# Patient Record
Sex: Female | Born: 1945 | Race: Black or African American | Hispanic: No | Smoking: Never smoker
Health system: Southern US, Community
[De-identification: ages and names within clinical notes are randomized; demographics above are authoritative.]

## PROBLEM LIST (undated history)

## (undated) DIAGNOSIS — T4145XA Adverse effect of unspecified anesthetic, initial encounter: Secondary | ICD-10-CM

## (undated) DIAGNOSIS — M1990 Unspecified osteoarthritis, unspecified site: Secondary | ICD-10-CM

## (undated) DIAGNOSIS — I739 Peripheral vascular disease, unspecified: Secondary | ICD-10-CM

## (undated) DIAGNOSIS — I10 Essential (primary) hypertension: Secondary | ICD-10-CM

## (undated) DIAGNOSIS — R112 Nausea with vomiting, unspecified: Secondary | ICD-10-CM

## (undated) DIAGNOSIS — T8859XA Other complications of anesthesia, initial encounter: Secondary | ICD-10-CM

## (undated) DIAGNOSIS — Z9889 Other specified postprocedural states: Secondary | ICD-10-CM

## (undated) DIAGNOSIS — E119 Type 2 diabetes mellitus without complications: Secondary | ICD-10-CM

## (undated) HISTORY — PX: BREAST EXCISIONAL BIOPSY: SUR124

## (undated) HISTORY — PX: EYE SURGERY: SHX253

## (undated) HISTORY — PX: CATARACT EXTRACTION, BILATERAL: SHX1313

## (undated) HISTORY — PX: ABDOMINAL HYSTERECTOMY: SHX81

---

## 1999-10-01 HISTORY — PX: GASTRIC BYPASS: SHX52

## 2011-05-08 ENCOUNTER — Ambulatory Visit (HOSPITAL_COMMUNITY)
Admission: RE | Admit: 2011-05-08 | Discharge: 2011-05-08 | Disposition: A | Discharge status: Home / Self Care | Payer: BC Managed Care – PPO | Source: Ambulatory Visit | Attending: Ophthalmology | Admitting: Ophthalmology

## 2011-05-08 DIAGNOSIS — E119 Type 2 diabetes mellitus without complications: Secondary | ICD-10-CM | POA: Insufficient documentation

## 2011-05-08 DIAGNOSIS — K219 Gastro-esophageal reflux disease without esophagitis: Secondary | ICD-10-CM | POA: Insufficient documentation

## 2011-05-08 DIAGNOSIS — H269 Unspecified cataract: Secondary | ICD-10-CM | POA: Insufficient documentation

## 2011-05-08 DIAGNOSIS — I10 Essential (primary) hypertension: Secondary | ICD-10-CM | POA: Insufficient documentation

## 2011-05-08 LAB — CBC
HCT: 36.3 % (ref 36.0–46.0)
Hemoglobin: 12.1 g/dL (ref 12.0–15.0)
RBC: 4.33 MIL/uL (ref 3.87–5.11)
WBC: 4.3 10*3/uL (ref 4.0–10.5)

## 2011-05-08 LAB — BASIC METABOLIC PANEL
BUN: 20 mg/dL (ref 6–23)
CO2: 26 mEq/L (ref 19–32)
Chloride: 105 mEq/L (ref 96–112)
GFR calc non Af Amer: >60 mL/min (ref >60)
Glucose, Bld: 163 mg/dL — ABNORMAL HIGH (ref 70–99)
Potassium: 4.5 mEq/L (ref 3.5–5.1)
Sodium: 140 mEq/L (ref 135–145)

## 2011-05-08 LAB — GLUCOSE, CAPILLARY: Glucose-Capillary: 152 mg/dL — ABNORMAL HIGH (ref 70–99)

## 2011-05-13 ENCOUNTER — Encounter: Payer: Self-pay | Admitting: Gastroenterology

## 2011-06-05 ENCOUNTER — Other Ambulatory Visit: Payer: BC Managed Care – PPO | Admitting: Gastroenterology

## 2011-07-18 NOTE — Op Note (Signed)
  NAME:  Alexandra Tran, Alexandra Tran NO.:  [id number]  MEDICAL RECORD NO.:  [id number]  LOCATION:  [facility]                         FACILITY:  [facility]  PHYSICIAN:  Chalmers Guest, M.D.     DATE OF BIRTH:  January 25, 1946  DATE OF PROCEDURE:  05/08/2011 DATE OF DISCHARGE:  05/08/2011                              OPERATIVE REPORT   PREOPERATIVE DIAGNOSIS:  Visually significant cataract right eye.  POSTOPERATIVE DIAGNOSIS:  Visually significant cataract right eye.  PROCEDURE:  Phacoemulsification intraocular lens implant.  COMPLICATIONS:  None.  ANESTHESIA:  Xylocaine 2% and a 50:50 mixture of 0.75% Marcaine with Wydase with epinephrine.  PROCEDURE:  The patient was given a peribulbar block in the operating room under monitored anesthesia with the aforementioned local anesthetic agent.  Following this, the pressure was applied to the eye and then the patient's face was prepped and draped in the usual sterile fashion with the surgeon sitting temporally.  A Weck-cel sponge was used to fixate the globe and then a 15-degree blade was used to enter through superior clear cornea.  Following this, Viscoat was injected in the eye.  An additional Weck-cel sponge was used to fixate the globe, then a 2.5-mm keratome blade was used in a stepwise fashion through temporal clear cornea to enter the eye.  Viscoat was injected. I bent the 25-gauge needle.  The assistant did not have a prebent needle for me available. After bending the needle, an incision was made in the anterior capsule and a continuous tear curvilinear capsulorrhexis was performed.  BSS was used to hydrodissect and hydrodelineate the nucleus.  Following this, the phacoemulsification unit was then used to sculpt the nucleus and the nucleus was divided into three quadrants and then the nuclear fragments were removed.  Viscoat was used to elevate the remaining epinucleus and the phacoemulsification was used to remove all nuclear material  from the eye.  The IA was then used to remove cortical fibers and stripping the posterior capsule.  The __________ cannula was used.  The curved IA was used to remove sub incisional cortex.  The posterior capsule remained intact.  Therefore, Provisc was injected in the eye.  The intraocular lens implant which was a preselected alkaline Aqua Soft SN 60 WF 20.5 diopter lens, SN number [id number] was placed in the lens injector and then injected in the eye.  The lens was positioned with the Kuglen hook. The IA was then used to remove viscoelastic from the eye.  An 10-0 nylon suture was placed.  Eye was pressurized, there being no leakage. Miochol was injected in the eye and all instrumentation removed.  The pupil was round at the end of the case.  TobraDex was placed in the eye. A patch and Fox shield were placed and the patient returned to recovery area in stable condition.    Chalmers Guest, M.D.    RW/MEDQ  D:  05/08/2011  T:  05/09/2011  Job:  [id number]  Electronically Signed by Chalmers Guest M.D. on 07/18/2011 05:56:22 PM

## 2011-10-10 ENCOUNTER — Ambulatory Visit: Payer: Self-pay | Admitting: Internal Medicine

## 2012-03-03 DIAGNOSIS — Z9884 Bariatric surgery status: Secondary | ICD-10-CM | POA: Insufficient documentation

## 2012-03-03 DIAGNOSIS — R531 Weakness: Secondary | ICD-10-CM | POA: Insufficient documentation

## 2013-04-05 ENCOUNTER — Ambulatory Visit: Payer: Self-pay | Admitting: Obstetrics and Gynecology

## 2013-04-09 ENCOUNTER — Ambulatory Visit: Payer: Self-pay | Admitting: Neurology

## 2013-04-22 ENCOUNTER — Ambulatory Visit: Payer: Self-pay | Admitting: Obstetrics and Gynecology

## 2013-12-08 DIAGNOSIS — R609 Edema, unspecified: Secondary | ICD-10-CM | POA: Insufficient documentation

## 2014-04-06 ENCOUNTER — Ambulatory Visit: Payer: Self-pay | Admitting: Internal Medicine

## 2014-08-09 DIAGNOSIS — E1139 Type 2 diabetes mellitus with other diabetic ophthalmic complication: Secondary | ICD-10-CM | POA: Insufficient documentation

## 2014-11-02 DIAGNOSIS — G5601 Carpal tunnel syndrome, right upper limb: Secondary | ICD-10-CM | POA: Insufficient documentation

## 2014-11-09 DIAGNOSIS — I739 Peripheral vascular disease, unspecified: Secondary | ICD-10-CM | POA: Insufficient documentation

## 2014-11-09 DIAGNOSIS — R600 Localized edema: Secondary | ICD-10-CM | POA: Insufficient documentation

## 2014-11-09 DIAGNOSIS — G8929 Other chronic pain: Secondary | ICD-10-CM | POA: Insufficient documentation

## 2014-11-09 DIAGNOSIS — J302 Other seasonal allergic rhinitis: Secondary | ICD-10-CM | POA: Insufficient documentation

## 2014-11-09 DIAGNOSIS — K589 Irritable bowel syndrome without diarrhea: Secondary | ICD-10-CM | POA: Insufficient documentation

## 2014-11-09 DIAGNOSIS — M5020 Other cervical disc displacement, unspecified cervical region: Secondary | ICD-10-CM | POA: Insufficient documentation

## 2014-11-09 DIAGNOSIS — M1990 Unspecified osteoarthritis, unspecified site: Secondary | ICD-10-CM | POA: Insufficient documentation

## 2014-11-09 DIAGNOSIS — N3281 Overactive bladder: Secondary | ICD-10-CM | POA: Insufficient documentation

## 2014-11-09 DIAGNOSIS — M797 Fibromyalgia: Secondary | ICD-10-CM | POA: Insufficient documentation

## 2015-03-24 ENCOUNTER — Other Ambulatory Visit: Payer: Self-pay | Admitting: Internal Medicine

## 2015-03-24 DIAGNOSIS — Z1231 Encounter for screening mammogram for malignant neoplasm of breast: Secondary | ICD-10-CM

## 2015-04-10 ENCOUNTER — Ambulatory Visit
Admission: RE | Admit: 2015-04-10 | Discharge: 2015-04-10 | Disposition: A | Discharge status: Home / Self Care | Payer: Medicare Other | Source: Ambulatory Visit | Attending: Internal Medicine | Admitting: Internal Medicine

## 2015-04-10 ENCOUNTER — Ambulatory Visit: Payer: BC Managed Care – PPO

## 2015-04-10 DIAGNOSIS — Z1231 Encounter for screening mammogram for malignant neoplasm of breast: Secondary | ICD-10-CM | POA: Diagnosis not present

## 2015-07-21 DIAGNOSIS — M4316 Spondylolisthesis, lumbar region: Secondary | ICD-10-CM | POA: Insufficient documentation

## 2015-07-21 DIAGNOSIS — M5137 Other intervertebral disc degeneration, lumbosacral region: Secondary | ICD-10-CM | POA: Insufficient documentation

## 2015-09-06 DIAGNOSIS — Z7689 Persons encountering health services in other specified circumstances: Secondary | ICD-10-CM | POA: Insufficient documentation

## 2015-09-06 DIAGNOSIS — J45909 Unspecified asthma, uncomplicated: Secondary | ICD-10-CM | POA: Insufficient documentation

## 2015-10-09 DIAGNOSIS — Z981 Arthrodesis status: Secondary | ICD-10-CM | POA: Insufficient documentation

## 2016-03-12 ENCOUNTER — Other Ambulatory Visit: Payer: Self-pay | Admitting: Internal Medicine

## 2016-03-12 DIAGNOSIS — Z1231 Encounter for screening mammogram for malignant neoplasm of breast: Secondary | ICD-10-CM

## 2016-04-01 ENCOUNTER — Other Ambulatory Visit: Payer: Self-pay | Admitting: Specialist

## 2016-04-01 DIAGNOSIS — I739 Peripheral vascular disease, unspecified: Secondary | ICD-10-CM

## 2016-04-08 ENCOUNTER — Inpatient Hospital Stay: Admission: RE | Admit: 2016-04-08 | Payer: BC Managed Care – PPO | Source: Ambulatory Visit

## 2016-04-12 ENCOUNTER — Ambulatory Visit: Payer: Medicare Other

## 2016-04-23 ENCOUNTER — Other Ambulatory Visit: Payer: Self-pay | Admitting: Specialist

## 2016-04-23 ENCOUNTER — Ambulatory Visit
Admission: RE | Admit: 2016-04-23 | Discharge: 2016-04-23 | Disposition: A | Discharge status: Home / Self Care | Payer: Medicare Other | Source: Ambulatory Visit | Attending: Specialist | Admitting: Specialist

## 2016-04-23 DIAGNOSIS — I739 Peripheral vascular disease, unspecified: Secondary | ICD-10-CM

## 2016-06-21 ENCOUNTER — Other Ambulatory Visit: Payer: Self-pay | Admitting: Specialist

## 2016-06-21 DIAGNOSIS — M542 Cervicalgia: Secondary | ICD-10-CM

## 2016-06-29 ENCOUNTER — Other Ambulatory Visit: Payer: Medicare Other

## 2016-07-08 ENCOUNTER — Ambulatory Visit
Admission: RE | Admit: 2016-07-08 | Discharge: 2016-07-08 | Disposition: A | Discharge status: Home / Self Care | Payer: Medicare Other | Source: Ambulatory Visit | Attending: Specialist | Admitting: Specialist

## 2016-07-08 DIAGNOSIS — M542 Cervicalgia: Secondary | ICD-10-CM

## 2016-07-15 ENCOUNTER — Ambulatory Visit (INDEPENDENT_AMBULATORY_CARE_PROVIDER_SITE_OTHER): Payer: Self-pay | Admitting: Specialist

## 2016-07-17 ENCOUNTER — Other Ambulatory Visit: Payer: Self-pay | Admitting: Vascular Surgery

## 2016-07-17 DIAGNOSIS — I7092 Chronic total occlusion of artery of the extremities: Secondary | ICD-10-CM

## 2016-07-22 ENCOUNTER — Telehealth (INDEPENDENT_AMBULATORY_CARE_PROVIDER_SITE_OTHER): Payer: Self-pay | Admitting: Specialist

## 2016-07-26 ENCOUNTER — Telehealth (INDEPENDENT_AMBULATORY_CARE_PROVIDER_SITE_OTHER): Payer: Self-pay | Admitting: Specialist

## 2016-07-26 NOTE — Telephone Encounter (Signed)
[employer] Life Disability Claim Form

## 2016-07-26 NOTE — Telephone Encounter (Signed)
Received form and working on this, will call patient when completed.

## 2016-07-30 ENCOUNTER — Encounter: Payer: Self-pay | Admitting: Vascular Surgery

## 2016-07-31 NOTE — Telephone Encounter (Signed)
Form completed, called patient and advise. Mailed copy of form to patient . Thanks. Nira Conn

## 2016-08-29 ENCOUNTER — Encounter (HOSPITAL_COMMUNITY): Payer: Medicare Other

## 2016-08-29 ENCOUNTER — Encounter: Payer: Medicare Other | Admitting: Vascular Surgery

## 2016-09-02 ENCOUNTER — Encounter: Payer: Self-pay | Admitting: Vascular Surgery

## 2016-09-05 ENCOUNTER — Encounter (INDEPENDENT_AMBULATORY_CARE_PROVIDER_SITE_OTHER): Payer: Self-pay | Admitting: Specialist

## 2016-09-05 ENCOUNTER — Encounter (HOSPITAL_COMMUNITY): Payer: Medicare Other

## 2016-09-05 ENCOUNTER — Ambulatory Visit (INDEPENDENT_AMBULATORY_CARE_PROVIDER_SITE_OTHER): Payer: Medicare Other | Admitting: Specialist

## 2016-09-05 ENCOUNTER — Encounter: Payer: Medicare Other | Admitting: Vascular Surgery

## 2016-09-05 VITALS — BP 111/78 | HR 67 | Ht 64.0 in | Wt 215.0 lb

## 2016-09-05 DIAGNOSIS — M4802 Spinal stenosis, cervical region: Secondary | ICD-10-CM | POA: Diagnosis not present

## 2016-09-05 DIAGNOSIS — I7092 Chronic total occlusion of artery of the extremities: Secondary | ICD-10-CM

## 2016-09-05 NOTE — Progress Notes (Signed)
Office Visit Note   Patient: Alexandra Tran           Date of Birth: 06/22/1946           MRN: [id number] Visit Date: 09/05/2016              Requested by: No referring provider defined for this encounter. PCP: Caprice Renshaw, MD   Assessment & Plan: Visit Diagnoses:  1. Spinal stenosis of cervical region     Plan: Reviewed cervical spine MRI with patient today. States that she cannot say if she is really having any symptoms in either arm. No weakness. Advised patient to follow up in 4 weeks for recheck and she will pay close attention to what her symptoms are. It was somewhat difficult obtaining a history from patient today.  Patient also brought up ongoing pain with her left knee. She's had previous injection a few months ago. We may consider repeating the x-rays when she returns.  Follow-Up Instructions: Return in about 4 weeks (around 10/03/2016).   Orders:  No orders of the defined types were placed in this encounter.  No orders of the defined types were placed in this encounter.     Procedures: No procedures performed   Clinical Data: No additional findings.   Subjective: Chief Complaint  Patient presents with  . Neck - Follow-up, Pain    Patient returns today to review her MRI Cervical spine. States her neck bothers her at times but it is nothing like her back was.    Patient states that "I'm not really sure if him having any pain down into my arms or having any tingling". Continues to have some neck pain that radiates into the bilateral trapezius and scapular area. Denies arm weakness. States that she's had cervical ESI's in the past and they did not give any relief and she does not want to have this done again. Review of Systems  Respiratory: Negative.   Musculoskeletal: Positive for neck pain.  Neurological: Negative for weakness and numbness.  Psychiatric/Behavioral:       Somewhat difficult to obtain history from patient states that she does have a  flight of ideas.     Objective: Vital Signs: Ht 5\' 4"  (1.626 m)   Wt 215 lb (97.5 kg)   BMI 36.90 kg/m   Physical Exam  Constitutional: She is oriented to person, place, and time. She appears well-developed. No distress.  HENT:  Head: Normocephalic and atraumatic.  Abdominal: She exhibits no distension.  Musculoskeletal: Normal range of motion.  Shoulders good range of motion. Negative impingement test. No focal motor deficits.  Neurological: She is alert and oriented to person, place, and time.  Skin: Skin is warm and dry.    Ortho Exam  Specialty Comments:  No specialty comments available.  Imaging: No results found.   PMFS History: There are no active problems to display for this patient.  No past medical history on file.  No family history on file.  Past Surgical History:  Procedure Laterality Date  . BREAST EXCISIONAL BIOPSY Right    negative   Social History   Occupational History  . Not on file.   Social History Main Topics  . Smoking status: Never Smoker  . Smokeless tobacco: Never Used  . Alcohol use No  . Drug use: No  . Sexual activity: Not on file    Study Result   CLINICAL DATA:  70 year old female with neck and bilateral shoulder pain since 2014. Initial  encounter.  EXAM: MRI CERVICAL SPINE WITHOUT CONTRAST  TECHNIQUE: Multiplanar, multisequence MR imaging of the cervical spine was performed. No intravenous contrast was administered.  COMPARISON:  04/09/2013 cervical spine MR.  FINDINGS: Exam is motion degraded.  Alignment: Normal.  Vertebrae: No worrisome osseous abnormality.  Cord: Evaluation limited by motion.  No obvious abnormality.  Posterior Fossa, vertebral arteries, paraspinal tissues: No worrisome abnormality.  Disc levels:  C2-3: Baseline narrowed canal. Minimal bulge. Slight narrowing ventral thecal sac.  C3-4:  Baseline slight narrowing of the canal.  C4-5:  Negative.  C5-6: Broad-based  disc osteophyte complex greater to left. Narrowing ventral thecal sac greater on left with minimal left-sided cord flattening. Mild right foraminal narrowing.  C6-7:  Negative.  C7-T1: Mild facet degenerative changes. No significant spinal stenosis or foraminal narrowing.  IMPRESSION: Exam is motion degraded.  C5-6 broad-based disc osteophyte complex greater to left. Narrowing ventral thecal sac greater on left with minimal left-sided cord flattening. Mild right foraminal narrowing. Findings have changed minimally since 2014.

## 2016-10-17 ENCOUNTER — Ambulatory Visit (INDEPENDENT_AMBULATORY_CARE_PROVIDER_SITE_OTHER): Payer: Medicare Other | Admitting: Specialist

## 2017-01-23 ENCOUNTER — Telehealth (INDEPENDENT_AMBULATORY_CARE_PROVIDER_SITE_OTHER): Payer: Self-pay | Admitting: Specialist

## 2017-01-23 NOTE — Telephone Encounter (Signed)
PT WANTS TO KNOW WHAT DR. WE REFERRED HER TO FOR HER LEG. STATED SHE CAN'T REMEMBER WHO IT WAS AND I DIDN'T SEE ANYTHING IN THE CHART.  [phone]

## 2017-01-24 NOTE — Telephone Encounter (Signed)
PT WANTS TO KNOW WHAT DR. WE REFERRED HER TO FOR HER LEG. STATED SHE CAN'T REMEMBER WHO IT WAS AND I DIDN'T SEE ANYTHING IN THE CHART.-----Please advise

## 2017-02-07 NOTE — Telephone Encounter (Signed)
We can see her for her knees if she wishes surgery otherwise we don't have a lot to offer. Alexandra Tran

## 2017-02-10 NOTE — Telephone Encounter (Signed)
Patient was calling about the referral that we had made back in September to [facility].  She couldn't remember the name of the place we had sent her to.

## 2017-02-17 DIAGNOSIS — K219 Gastro-esophageal reflux disease without esophagitis: Secondary | ICD-10-CM | POA: Insufficient documentation

## 2017-02-17 DIAGNOSIS — N6019 Diffuse cystic mastopathy of unspecified breast: Secondary | ICD-10-CM | POA: Insufficient documentation

## 2017-02-17 DIAGNOSIS — E559 Vitamin D deficiency, unspecified: Secondary | ICD-10-CM | POA: Insufficient documentation

## 2017-02-17 DIAGNOSIS — I872 Venous insufficiency (chronic) (peripheral): Secondary | ICD-10-CM | POA: Insufficient documentation

## 2017-02-17 DIAGNOSIS — J329 Chronic sinusitis, unspecified: Secondary | ICD-10-CM | POA: Insufficient documentation

## 2017-02-17 DIAGNOSIS — H40059 Ocular hypertension, unspecified eye: Secondary | ICD-10-CM | POA: Insufficient documentation

## 2017-02-17 DIAGNOSIS — E785 Hyperlipidemia, unspecified: Secondary | ICD-10-CM | POA: Insufficient documentation

## 2017-02-17 DIAGNOSIS — I878 Other specified disorders of veins: Secondary | ICD-10-CM | POA: Insufficient documentation

## 2017-02-17 DIAGNOSIS — K5909 Other constipation: Secondary | ICD-10-CM | POA: Insufficient documentation

## 2017-04-17 DIAGNOSIS — E113553 Type 2 diabetes mellitus with stable proliferative diabetic retinopathy, bilateral: Secondary | ICD-10-CM | POA: Insufficient documentation

## 2017-04-17 DIAGNOSIS — Z961 Presence of intraocular lens: Secondary | ICD-10-CM | POA: Insufficient documentation

## 2017-07-31 ENCOUNTER — Ambulatory Visit (INDEPENDENT_AMBULATORY_CARE_PROVIDER_SITE_OTHER): Payer: Medicare Other | Admitting: Specialist

## 2018-01-05 DIAGNOSIS — H401124 Primary open-angle glaucoma, left eye, indeterminate stage: Secondary | ICD-10-CM | POA: Insufficient documentation

## 2018-01-05 DIAGNOSIS — H401114 Primary open-angle glaucoma, right eye, indeterminate stage: Secondary | ICD-10-CM | POA: Insufficient documentation

## 2018-05-22 ENCOUNTER — Telehealth (INDEPENDENT_AMBULATORY_CARE_PROVIDER_SITE_OTHER): Payer: Self-pay | Admitting: Specialist

## 2018-05-22 NOTE — Telephone Encounter (Signed)
Please add patient to cancellation list. Appt currently scheduled for 10/3. Patients # [phone]

## 2018-05-25 ENCOUNTER — Ambulatory Visit (INDEPENDENT_AMBULATORY_CARE_PROVIDER_SITE_OTHER): Payer: Medicare Other | Admitting: Specialist

## 2018-05-25 ENCOUNTER — Encounter (INDEPENDENT_AMBULATORY_CARE_PROVIDER_SITE_OTHER): Payer: Self-pay | Admitting: Specialist

## 2018-05-25 ENCOUNTER — Ambulatory Visit (INDEPENDENT_AMBULATORY_CARE_PROVIDER_SITE_OTHER): Payer: Self-pay

## 2018-05-25 VITALS — BP 130/71 | HR 87 | Ht 64.0 in | Wt 206.0 lb

## 2018-05-25 DIAGNOSIS — I739 Peripheral vascular disease, unspecified: Secondary | ICD-10-CM | POA: Diagnosis not present

## 2018-05-25 DIAGNOSIS — M25561 Pain in right knee: Secondary | ICD-10-CM | POA: Diagnosis not present

## 2018-05-25 DIAGNOSIS — M11261 Other chondrocalcinosis, right knee: Secondary | ICD-10-CM

## 2018-05-25 DIAGNOSIS — G8929 Other chronic pain: Secondary | ICD-10-CM | POA: Diagnosis not present

## 2018-05-25 DIAGNOSIS — M1711 Unilateral primary osteoarthritis, right knee: Secondary | ICD-10-CM | POA: Diagnosis not present

## 2018-05-25 MED ORDER — ONDANSETRON HCL 4 MG PO TABS: 4.0000 mg | ORAL_TABLET | Freq: Three times a day (TID) | ORAL | 0 refills | Status: DC | PRN

## 2018-05-25 MED ORDER — HYDROCODONE-ACETAMINOPHEN 5-325 MG PO TABS: 1.0000 | ORAL_TABLET | Freq: Four times a day (QID) | ORAL | 0 refills | Status: DC | PRN

## 2018-05-25 NOTE — Patient Instructions (Addendum)
The patient history, physical examination and imaging studies are consistent with advanced degenerative joint disease of the right knee. The patient has failed conservative treatment.  The clearance notes were reviewed.  After discussion with the patient it was felt that Total Knee Replacement was indicated. The procedure,  risks, and benefits of total knee arthroplasty were presented and reviewed. The risks including but not limited to aseptic loosening, infection, blood clots, vascular and nerve injury, stiffness, patella tracking problems and fracture complications among others were discussed. The patient acknowledged the explanation, agreed to proceed with total knee replacement. Avoid bending, stooping and avoid lifting weights greater than 10 lbs. Avoid prolong standing and walking. Order for a new walker with wheels. Surgery scheduling secretary Kandice Hams, will call you in the next week to schedule for surgery.  Surgery recommended is right knee replacemen   Knee is suffering from osteoarthritis, only real proven treatments are Weight loss, NSIADs like diclofenac and exercise. Well padded shoes help. Ice the knee 2-3 times a day 15-20 mins at a time.

## 2018-05-25 NOTE — Telephone Encounter (Signed)
I called and put pt in a cancellation spot for today @345 

## 2018-05-25 NOTE — Progress Notes (Addendum)
Office Visit Note   Patient: Alexandra Tran           Date of Birth: 1946-08-16           MRN: [id number] Visit Date: 05/25/2018              Requested by: Caprice Renshaw, MD [address] [place], [place] [address] PCP: Caprice Renshaw, MD   Assessment & Plan: Visit Diagnoses:  1. Chronic pain of right knee   2. Osteoarthritis of right patellofemoral joint   3. Chondrocalcinosis of right knee   4. Unilateral primary osteoarthritis, right knee   5. Peripheral vascular disease of lower extremity (HCC)     Plan: The patient history, physical examination and imaging studies are consistent with advanced degenerative joint disease of the right knee. The patient has failed conservative treatment.  The clearance notes were reviewed.  After discussion with the patient it was felt that Total Knee Replacement was indicated. The procedure,  risks, and benefits of total knee arthroplasty were presented and reviewed. The risks including but not limited to aseptic loosening, infection, blood clots, vascular and nerve injury, stiffness, patella tracking problems and fracture complications among others were discussed. The patient acknowledged the explanation, agreed to proceed with total knee replacement. Avoid bending, stooping and avoid lifting weights greater than 10 lbs. Avoid prolong standing and walking. Order for a new walker with wheels. Surgery scheduling secretary Kandice Hams, will call you in the next week to schedule for surgery.  Surgery recommended is right knee replacemen   Knee is suffering from osteoarthritis, only real proven treatments are Weight loss, NSIADs like diclofenac and exercise. Well padded shoes help. Ice the knee 2-3 times a day 15-20 mins at a time.  Follow-Up Instructions: Return in about 4 weeks (around 06/22/2018) for post op follow up..   Orders:  Orders Placed This Encounter  Procedures  . XR KNEE 3 VIEW RIGHT   Meds ordered this encounter    Medications  . HYDROcodone-acetaminophen (NORCO/VICODIN) 5-325 MG tablet    Sig: Take 1-2 tablets by mouth every 6 (six) hours as needed.    Dispense:  40 tablet    Refill:  0    Was given a prescription for oxycodone which she did not fill in [facility] yesterday. She is too drowsy with the oxycodone.  . ondansetron (ZOFRAN) 4 MG tablet    Sig: Take 1 tablet (4 mg total) by mouth every 8 (eight) hours as needed for nausea or vomiting.    Dispense:  20 tablet    Refill:  0      Procedures: No procedures performed   Clinical Data: No additional findings.   Subjective: Chief Complaint  Patient presents with  . Right Knee - Pain    72 year old female with 3 week history of right knee pain. She has pain with standing and weight bearing on the right leg. Pain is present at night and with first standing in the AM. I have to hollar due to to the pain. I am taking  Ibuprofen, voltaren gel and presently oxycodone from her visit to [facility]. She has seen Dr. Harl Bowie and was referred for evaluation by Dr. Harl Bowie after undergoing long segment thoracolumbar fusion. She has been participating in PT and water aerobics with good improvement in her  Discomfort until about 3 weeks ago. She has swelling in the right leg. No numbness or paresthesias.    Review of Systems  Constitutional: Positive for activity change and unexpected weight change. Negative for appetite change, chills, diaphoresis, fatigue and fever.  HENT: Negative.  Negative for congestion, dental problem, drooling, ear discharge, ear pain, facial swelling, hearing loss, mouth sores, nosebleeds, postnasal drip, rhinorrhea, sinus pressure, sinus pain, sneezing, sore throat, tinnitus, trouble swallowing and voice change.   Eyes: Negative.  Negative for photophobia, pain, discharge, redness, itching and visual disturbance.  Respiratory: Negative.  Negative for chest tightness, shortness of breath and  wheezing.   Cardiovascular: Negative.  Negative for chest pain, palpitations and leg swelling.  Gastrointestinal: Positive for nausea and vomiting. Negative for abdominal distention.  Endocrine: Negative.  Negative for cold intolerance, heat intolerance, polydipsia, polyphagia and polyuria.  Genitourinary: Negative.  Negative for difficulty urinating, dysuria, flank pain and hematuria.  Musculoskeletal: Positive for arthralgias, gait problem and joint swelling. Negative for back pain, myalgias, neck pain and neck stiffness.  Skin: Negative.  Negative for color change, pallor, rash and wound.  Allergic/Immunologic: Negative.  Negative for environmental allergies, food allergies and immunocompromised state.  Neurological: Negative for dizziness, tremors, seizures, syncope, facial asymmetry, speech difficulty, weakness, light-headedness, numbness and headaches.  Hematological: Negative.  Negative for adenopathy. Does not bruise/bleed easily.  Psychiatric/Behavioral: Negative.  Negative for agitation, behavioral problems, confusion, dysphoric mood, hallucinations, self-injury, sleep disturbance and suicidal ideas. The patient is not nervous/anxious and is not hyperactive.      Objective: Vital Signs: BP 130/71   Pulse 87   Ht 5\' 4"  (1.626 m)   Wt 206 lb (93.4 kg)   BMI 35.36 kg/m   Physical Exam  Constitutional: She is oriented to person, place, and time. She appears well-developed and well-nourished.  HENT:  Head: Normocephalic and atraumatic.  Eyes: Pupils are equal, round, and reactive to light. EOM are normal.  Neck: Normal range of motion. Neck supple.  Pulmonary/Chest: Effort normal and breath sounds normal.  Abdominal: Soft. Bowel sounds are normal.  Musculoskeletal:       Right knee: She exhibits effusion.  Neurological: She is alert and oriented to person, place, and time.  Skin: Skin is warm and dry.  Psychiatric: She has a normal mood and affect. Her behavior is normal.  Judgment and thought content normal.    Right Knee Exam   Tenderness  The patient is experiencing tenderness in the medial joint line, lateral retinaculum, lateral joint line and patella.  Range of Motion  Extension:  -10 abnormal  Flexion:  110 abnormal   Tests  McMurray:  Medial - positive  Varus: positive  Lachman:  Anterior - negative    Posterior - negative Drawer:  Anterior - negative    Posterior - negative Pivot shift: negative Patellar apprehension: negative  Other  Erythema: absent Scars: absent Sensation: normal Swelling: mild Effusion: effusion present   Left Knee Exam   Range of Motion  Extension: normal  Flexion: normal       Specialty Comments:  No specialty comments available.  Imaging: No results found.   PMFS History: There are no active problems to display for this patient.  History reviewed. No pertinent past medical history.  History reviewed. No pertinent family history.  Past Surgical History:  Procedure Laterality Date  . BREAST EXCISIONAL BIOPSY Right    negative   Social History   Occupational History  . Not on file  Tobacco Use  . Smoking status: Never Smoker  . Smokeless tobacco: Never Used  Substance and Sexual Activity  . Alcohol use: No  . Drug  use: No  . Sexual activity: Not on file

## 2018-05-27 ENCOUNTER — Telehealth (INDEPENDENT_AMBULATORY_CARE_PROVIDER_SITE_OTHER): Payer: Self-pay | Admitting: Specialist

## 2018-05-27 NOTE — Telephone Encounter (Signed)
Patient would like a call back from Dr. Louanne Tran because she cant remember anything he said about the procedure she would need before surgery, she said it would be to check her blood flow/veins to make sure shes ok for surgery. She said she hasnt heard anything from anyone [phone]

## 2018-05-27 NOTE — Telephone Encounter (Signed)
Patient called and stat that Alexandra Tran spoke to her about going to a MD about a artery prior to surgery on knee.   Please call patient in regard to the referral. Advised patient there was no referral.

## 2018-05-28 ENCOUNTER — Other Ambulatory Visit (INDEPENDENT_AMBULATORY_CARE_PROVIDER_SITE_OTHER): Payer: Self-pay | Admitting: Specialist

## 2018-05-28 DIAGNOSIS — I739 Peripheral vascular disease, unspecified: Secondary | ICD-10-CM

## 2018-05-28 NOTE — Telephone Encounter (Signed)
I called and advised patient that the order has been placed as of 05/25/18 (her appt date).  That we are working on getting it scheduled that it may take a few days to get it scheduled. She states that she understands.

## 2018-05-28 NOTE — Addendum Note (Signed)
Addended by: Minda Ditto, Geoffery Spruce on: 05/28/2018 03:36 PM   Modules accepted: Orders

## 2018-05-28 NOTE — Telephone Encounter (Signed)
Alexandra Tran,  I have read your note,mine and Meredith's. This patient is insisting a call back from you to find out why the referral has not been put in and talk to you about an injections as well.  Please call patient to advise.  [phone]

## 2018-05-29 NOTE — Telephone Encounter (Signed)
Patient is asking for something to go along with the hydrocodone for soreness? She understands if not Patients #  [phone]

## 2018-05-29 NOTE — Telephone Encounter (Signed)
I called and advised to try 2 Advil in the middle if she needs to.

## 2018-06-03 ENCOUNTER — Ambulatory Visit (HOSPITAL_BASED_OUTPATIENT_CLINIC_OR_DEPARTMENT_OTHER)
Admission: RE | Admit: 2018-06-03 | Discharge: 2018-06-03 | Disposition: A | Discharge status: Home / Self Care | Payer: Medicare Other | Source: Ambulatory Visit | Attending: Specialist | Admitting: Specialist

## 2018-06-03 ENCOUNTER — Ambulatory Visit (HOSPITAL_COMMUNITY)
Admission: RE | Admit: 2018-06-03 | Discharge: 2018-06-03 | Disposition: A | Discharge status: Home / Self Care | Payer: Medicare Other | Source: Ambulatory Visit | Attending: Specialist | Admitting: Specialist

## 2018-06-03 DIAGNOSIS — I739 Peripheral vascular disease, unspecified: Secondary | ICD-10-CM

## 2018-06-03 NOTE — Progress Notes (Signed)
VASCULAR LAB PRELIMINARY  ARTERIAL  ABI completed:  Right:  Resting right ankle-brachial index indicates noncompressible right lower extremity arteries.The right toe-brachial index is abnormal.  Comparison study done in 2017, right ABI incompressible. Right TBI 0.49.   Left:  Resting left ankle-brachial index is within normal range. No evidence of significant left lower extremity arterial disease. The left toe-brachial index is abnormal.  Comparison study done in 2017, left ABI incompressible, TBI 0.69.     RIGHT    LEFT    PRESSURE WAVEFORM  PRESSURE WAVEFORM  BRACHIAL 131 Triphasic BRACHIAL 133 Triphasic  DP 84 Monophasic DP 128 Triphasic  PT 249 Monophasic PT 163 Biphasic  GREAT TOE 50  GREAT TOE 71     RIGHT LEFT  ABI/TBI 1.87/0.38 1.23/0.53     Alexandra Tran  Alexandra Tran, RVT 06/03/2018, 12:41 PM

## 2018-06-03 NOTE — Progress Notes (Signed)
Preliminary notes--Bilateral lower extremities arterial duplex exam completed.  No evidence of arterial stenosis.  Alexandra Tran (RDMS RVT) 06/03/18 12:53 PM

## 2018-06-05 ENCOUNTER — Other Ambulatory Visit (INDEPENDENT_AMBULATORY_CARE_PROVIDER_SITE_OTHER): Payer: Self-pay | Admitting: Specialist

## 2018-06-05 MED ORDER — HYDROCODONE-ACETAMINOPHEN 5-325 MG PO TABS: 1.0000 | ORAL_TABLET | Freq: Four times a day (QID) | ORAL | 0 refills | Status: DC | PRN

## 2018-06-05 NOTE — Telephone Encounter (Signed)
Patient called needing Rx refilled (Hydrocodone) Patient asked what can Dr Louanne Skye do to help her with the pain she is having. Patient said she had the test done this week. Patient said the pain is keeping her up at night. The number to contact patient is [phone]

## 2018-06-05 NOTE — Telephone Encounter (Signed)
Plan is to perform a total knee replacement. She may use a walker until surgery is done. Ice but hydrocodone only for pain or after surgery she may be to tolerant of narcotics for the pain meds to do any good. jen

## 2018-06-08 ENCOUNTER — Telehealth (INDEPENDENT_AMBULATORY_CARE_PROVIDER_SITE_OTHER): Payer: Self-pay

## 2018-06-08 NOTE — Telephone Encounter (Signed)
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 It has not changed a great deal since last study 2-3 years ago. She has some risk of developing a problem with distal circulation but the circulation about the knee is In good condition. I will discuss her study with the vascular surgeon Dr. Ilsa Iha and call and speak with her daughter whom she has indicated she wants me to talk with tomorrow.

## 2018-06-08 NOTE — Telephone Encounter (Signed)
Patient states she called on Friday and spoke to someone at the front desk.  I don't see a Message taken. I did advise on message Dr Louanne Skye responded to. She states why no one had called her. I told her he had responded after hours and it can take up to 24-48 hours.    She would like to get her pain meds. (I advised her Rx is printed and is pending signature, he will be here this PM after 2.) She would like a call back from someone when Rx is ready for pick up.   She also states she had 2 studies done and would like to know the results.    CB: [phone]

## 2018-06-08 NOTE — Telephone Encounter (Signed)
Please advsie

## 2018-06-09 ENCOUNTER — Telehealth (INDEPENDENT_AMBULATORY_CARE_PROVIDER_SITE_OTHER): Payer: Self-pay | Admitting: Specialist

## 2018-06-09 NOTE — Telephone Encounter (Signed)
I called patient and advised to contact vascular lab for copy and gave patient phone number

## 2018-06-09 NOTE — Telephone Encounter (Signed)
The test are in the notes or encounter tabs in chart dated 06/03/18

## 2018-06-09 NOTE — Telephone Encounter (Signed)
Kathlee Nations notified patient of this

## 2018-06-09 NOTE — Telephone Encounter (Signed)
Patient called wanting a copy of tests done last week. I told her I didn't see reports. She wants to know how to get copy and wants to know exact name of what she had done. Stated Dr. Louanne Skye called her yesterday and she doesn't understand what's going on. pts callback [phone]

## 2018-06-09 NOTE — Telephone Encounter (Signed)
Please see below.

## 2018-06-16 ENCOUNTER — Other Ambulatory Visit: Payer: Self-pay | Admitting: Internal Medicine

## 2018-06-16 ENCOUNTER — Telehealth (INDEPENDENT_AMBULATORY_CARE_PROVIDER_SITE_OTHER): Payer: Self-pay | Admitting: Specialist

## 2018-06-16 DIAGNOSIS — Z1231 Encounter for screening mammogram for malignant neoplasm of breast: Secondary | ICD-10-CM

## 2018-06-16 NOTE — Telephone Encounter (Signed)
Spoke with patient, advised PCP clearance not received yet.  She will call their office to follow up.

## 2018-06-16 NOTE — Telephone Encounter (Signed)
Patient called asked for a call back concerning scheduling surgery. Patient asked will she be scheduled  to have surgery soon. The number to contact patient is [phone]

## 2018-06-19 ENCOUNTER — Telehealth (INDEPENDENT_AMBULATORY_CARE_PROVIDER_SITE_OTHER): Payer: Self-pay | Admitting: Specialist

## 2018-06-19 NOTE — Telephone Encounter (Signed)
I called and spoke with patient, Alexandra Tran wants you to call her and let her know what the paper from Dr. Lovena Le said.

## 2018-06-19 NOTE — Telephone Encounter (Signed)
Patient called wanting information on surgery. Stated too much stress for her.  Advised Sherri scheduled. She states she wants to speak w/Nitka's office.  Please call patient as soon as you can.   [phone]

## 2018-06-22 ENCOUNTER — Inpatient Hospital Stay: Admission: RE | Admit: 2018-06-22 | Payer: Medicare Other | Source: Ambulatory Visit

## 2018-06-23 NOTE — Telephone Encounter (Signed)
I called patient and discussed scheduling surgery.  She will call me back to confirm date.

## 2018-06-24 ENCOUNTER — Other Ambulatory Visit (INDEPENDENT_AMBULATORY_CARE_PROVIDER_SITE_OTHER): Payer: Self-pay | Admitting: Specialist

## 2018-06-24 ENCOUNTER — Ambulatory Visit: Payer: Medicare Other

## 2018-06-24 NOTE — Telephone Encounter (Signed)
Ondansetron refill request.

## 2018-06-26 ENCOUNTER — Telehealth (INDEPENDENT_AMBULATORY_CARE_PROVIDER_SITE_OTHER): Payer: Self-pay | Admitting: Specialist

## 2018-06-26 NOTE — Telephone Encounter (Signed)
Rx refill Zofran [facility] in [facility]

## 2018-06-26 NOTE — Telephone Encounter (Signed)
I called and advised rx was sent in last night

## 2018-07-02 ENCOUNTER — Ambulatory Visit (INDEPENDENT_AMBULATORY_CARE_PROVIDER_SITE_OTHER): Payer: Medicare Other | Admitting: Specialist

## 2018-07-02 ENCOUNTER — Ambulatory Visit (INDEPENDENT_AMBULATORY_CARE_PROVIDER_SITE_OTHER): Payer: Medicare Other | Admitting: Surgery

## 2018-07-02 ENCOUNTER — Encounter (INDEPENDENT_AMBULATORY_CARE_PROVIDER_SITE_OTHER): Payer: Self-pay | Admitting: Surgery

## 2018-07-02 VITALS — BP 120/70 | HR 101 | Ht 64.0 in | Wt 205.0 lb

## 2018-07-02 DIAGNOSIS — M25561 Pain in right knee: Secondary | ICD-10-CM

## 2018-07-02 DIAGNOSIS — G8929 Other chronic pain: Secondary | ICD-10-CM

## 2018-07-02 DIAGNOSIS — M1711 Unilateral primary osteoarthritis, right knee: Secondary | ICD-10-CM

## 2018-07-02 NOTE — Progress Notes (Signed)
72year-old black female history of end-stage DJD right knee comes in for preoperative evaluation.  Knee symptoms unchanged from previous visit.  She is going to proceed with total knee replacement as scheduled.  We received preop cardiac and medical clearances.  All questions answered.  Full history and physical performed.

## 2018-07-02 NOTE — Pre-Procedure Instructions (Signed)
Alexandra Tran  07/02/2018      [id number], [place] - [address] [place] [address] Phone: [phone] Fax: [phone]    Your procedure is scheduled on Monday, October 14th.  Report to [facility] Admitting at 5:30 A.M.  Call this number if you have problems the morning of surgery:  [phone]   Remember:  Do not eat or drink after midnight.    Take these medicines the morning of surgery with A SIP OF WATER  cilostazol (PLETAL) latanoprost (XALATAN)  linaclotide (LINZESS)  metoprolol succinate (TOPROL-XL) montelukast (SINGULAIR)  oxybutynin (DITROPAN) pantoprazole (PROTONIX) Inhaler-as needed (bring with you to the hospital) HYDROcodone-acetaminophen (NORCO/VICODIN)-as needed.   STOP TAKING YOUR phentermine (ADIPEX-P) STARTING TODAY until after surgery.   7 days prior to surgery STOP taking any Aspirin(unless otherwise instructed by your surgeon), volataren gel, Aleve, Naproxen, Ibuprofen, Motrin, Advil, Goody's, BC's, all herbal medications, fish oil, and all vitamins.   WHAT DO I DO ABOUT MY DIABETES MEDICATION?   Marland Kitchen Do not take oral diabetes medicines (pills) the morning of surgery.  . THE NIGHT BEFORE SURGERY, take 10 units of TRESIBA insulin.       . THE MORNING OF SURGERY DO NOT TAKE YOUR metformin (FORTAMET).      How to Manage Your Diabetes Before and After Surgery  Why is it important to control my blood sugar before and after surgery? . Improving blood sugar levels before and after surgery helps healing and can limit problems. . A way of improving blood sugar control is eating a healthy diet by: o  Eating less sugar and carbohydrates o  Increasing activity/exercise o  Talking with your doctor about reaching your blood sugar goals . High blood sugars (greater than 180 mg/dL) can raise your risk of infections and slow your recovery, so you will need to focus on controlling your diabetes during the  weeks before surgery. . Make sure that the doctor who takes care of your diabetes knows about your planned surgery including the date and location.  How do I manage my blood sugar before surgery? . Check your blood sugar at least 4 times a day, starting 2 days before surgery, to make sure that the level is not too high or low. o Check your blood sugar the morning of your surgery when you wake up and every 2 hours until you get to the Short Stay unit. . If your blood sugar is less than 70 mg/dL, you will need to treat for low blood sugar: o Do not take insulin. o Treat a low blood sugar (less than 70 mg/dL) with  cup of clear juice (cranberry or apple), 4 glucose tablets, OR glucose gel. o Recheck blood sugar in 15 minutes after treatment (to make sure it is greater than 70 mg/dL). If your blood sugar is not greater than 70 mg/dL on recheck, call [phone] for further instructions. . Report your blood sugar to the short stay nurse when you get to Short Stay.  . If you are admitted to the hospital after surgery: o Your blood sugar will be checked by the staff and you will probably be given insulin after surgery (instead of oral diabetes medicines) to make sure you have good blood sugar levels. o The goal for blood sugar control after surgery is 80-180 mg/dL.     Do not wear jewelry, make-up or nail polish.  Do not wear lotions, powders, or perfumes, or  deodorant.  Do not shave 48 hours prior to surgery.  Men may shave face and neck.  Do not bring valuables to the hospital.  [facility] is not responsible for any belongings or valuables.  Contacts, dentures or bridgework may not be worn into surgery.  Leave your suitcase in the car.  After surgery it may be brought to your room.  For patients admitted to the hospital, discharge time will be determined by your treatment team.  Patients discharged the day of surgery will not be allowed to drive home.   Special instructions:   [facility]- Preparing For Surgery  Before surgery, you can play an important role. Because skin is not sterile, your skin needs to be as free of germs as possible. You can reduce the number of germs on your skin by washing with CHG (chlorahexidine gluconate) Soap before surgery.  CHG is an antiseptic cleaner which kills germs and bonds with the skin to continue killing germs even after washing.    Oral Hygiene is also important to reduce your risk of infection.  Remember - BRUSH YOUR TEETH THE MORNING OF SURGERY WITH YOUR REGULAR TOOTHPASTE  Please do not use if you have an allergy to CHG or antibacterial soaps. If your skin becomes reddened/irritated stop using the CHG.  Do not shave (including legs and underarms) for at least 48 hours prior to first CHG shower. It is OK to shave your face.  Please follow these instructions carefully.   1. Shower the NIGHT BEFORE SURGERY and the MORNING OF SURGERY with CHG.   2. If you chose to wash your hair, wash your hair first as usual with your normal shampoo.  3. After you shampoo, rinse your hair and body thoroughly to remove the shampoo.  4. Use CHG as you would any other liquid soap. You can apply CHG directly to the skin and wash gently with a scrungie or a clean washcloth.   5. Apply the CHG Soap to your body ONLY FROM THE NECK DOWN.  Do not use on open wounds or open sores. Avoid contact with your eyes, ears, mouth and genitals (private parts). Wash Face and genitals (private parts)  with your normal soap.  6. Wash thoroughly, paying special attention to the area where your surgery will be performed.  7. Thoroughly rinse your body with warm water from the neck down.  8. DO NOT shower/wash with your normal soap after using and rinsing off the CHG Soap.  9. Pat yourself dry with a CLEAN TOWEL.  10. Wear CLEAN PAJAMAS to bed the night before surgery, wear comfortable clothes the morning of surgery  11. Place CLEAN SHEETS on your bed the night of  your first shower and DO NOT SLEEP WITH PETS.    Day of Surgery:  Do not apply any deodorants/lotions.  Please wear clean clothes to the hospital/surgery center.   Remember to brush your teeth WITH YOUR REGULAR TOOTHPASTE.  Please read over the following fact sheets that you were given.

## 2018-07-03 ENCOUNTER — Encounter (HOSPITAL_COMMUNITY): Payer: Self-pay

## 2018-07-03 ENCOUNTER — Ambulatory Visit (HOSPITAL_COMMUNITY)
Admission: RE | Admit: 2018-07-03 | Discharge: 2018-07-03 | Disposition: A | Discharge status: Home / Self Care | Payer: Medicare Other | Source: Ambulatory Visit | Attending: Surgery | Admitting: Surgery

## 2018-07-03 ENCOUNTER — Encounter (HOSPITAL_COMMUNITY)
Admission: RE | Admit: 2018-07-03 | Discharge: 2018-07-03 | Disposition: A | Discharge status: Home / Self Care | Payer: Medicare Other | Source: Ambulatory Visit | Attending: Specialist | Admitting: Specialist

## 2018-07-03 ENCOUNTER — Other Ambulatory Visit: Payer: Self-pay

## 2018-07-03 DIAGNOSIS — Z7984 Long term (current) use of oral hypoglycemic drugs: Secondary | ICD-10-CM | POA: Insufficient documentation

## 2018-07-03 DIAGNOSIS — I10 Essential (primary) hypertension: Secondary | ICD-10-CM | POA: Diagnosis not present

## 2018-07-03 DIAGNOSIS — Z01818 Encounter for other preprocedural examination: Secondary | ICD-10-CM

## 2018-07-03 DIAGNOSIS — I498 Other specified cardiac arrhythmias: Secondary | ICD-10-CM | POA: Diagnosis not present

## 2018-07-03 DIAGNOSIS — M1711 Unilateral primary osteoarthritis, right knee: Secondary | ICD-10-CM | POA: Diagnosis not present

## 2018-07-03 DIAGNOSIS — E119 Type 2 diabetes mellitus without complications: Secondary | ICD-10-CM | POA: Diagnosis not present

## 2018-07-03 DIAGNOSIS — Z79899 Other long term (current) drug therapy: Secondary | ICD-10-CM | POA: Insufficient documentation

## 2018-07-03 HISTORY — DX: Other complications of anesthesia, initial encounter: T88.59XA

## 2018-07-03 HISTORY — DX: Nausea with vomiting, unspecified: R11.2

## 2018-07-03 HISTORY — DX: Essential (primary) hypertension: I10

## 2018-07-03 HISTORY — DX: Other specified postprocedural states: Z98.890

## 2018-07-03 HISTORY — DX: Adverse effect of unspecified anesthetic, initial encounter: T41.45XA

## 2018-07-03 HISTORY — DX: Type 2 diabetes mellitus without complications: E11.9

## 2018-07-03 LAB — COMPREHENSIVE METABOLIC PANEL
ALBUMIN: 3.3 g/dL — AB (ref 3.5–5.0)
ALT: 26 U/L (ref 0–44)
AST: 25 U/L (ref 15–41)
Alkaline Phosphatase: 70 U/L (ref 38–126)
Anion gap: 10 (ref 5–15)
BUN: 17 mg/dL (ref 8–23)
CHLORIDE: 101 mmol/L (ref 98–111)
CO2: 25 mmol/L (ref 22–32)
Calcium: 8.7 mg/dL — ABNORMAL LOW (ref 8.9–10.3)
Creatinine, Ser: 0.72 mg/dL (ref 0.44–1.00)
GFR calc Af Amer: >60 mL/min (ref >60)
GFR calc non Af Amer: >60 mL/min (ref >60)
Glucose, Bld: 114 mg/dL — ABNORMAL HIGH (ref 70–99)
POTASSIUM: 3.8 mmol/L (ref 3.5–5.1)
SODIUM: 136 mmol/L (ref 135–145)
TOTAL PROTEIN: 5.9 g/dL — AB (ref 6.5–8.1)
Total Bilirubin: 0.6 mg/dL (ref 0.3–1.2)

## 2018-07-03 LAB — PROTIME-INR
INR: 1.04
Prothrombin Time: 13.5 seconds (ref 11.4–15.2)

## 2018-07-03 LAB — URINALYSIS, ROUTINE W REFLEX MICROSCOPIC
BILIRUBIN URINE: NEGATIVE
Glucose, UA: NEGATIVE mg/dL
Hgb urine dipstick: NEGATIVE
Ketones, ur: NEGATIVE mg/dL
LEUKOCYTES UA: NEGATIVE
NITRITE: NEGATIVE
Protein, ur: NEGATIVE mg/dL
Specific Gravity, Urine: 1.011 (ref 1.005–1.030)
pH: 7 (ref 5.0–8.0)

## 2018-07-03 LAB — CBC
HEMATOCRIT: 38.4 % (ref 36.0–46.0)
Hemoglobin: 12.1 g/dL (ref 12.0–15.0)
MCH: 30.6 pg (ref 26.0–34.0)
MCHC: 31.5 g/dL (ref 30.0–36.0)
MCV: 97 fL (ref 78.0–100.0)
PLATELETS: 198 10*3/uL (ref 150–400)
RBC: 3.96 MIL/uL (ref 3.87–5.11)
RDW: 13.2 % (ref 11.5–15.5)
WBC: 4.3 10*3/uL (ref 4.0–10.5)

## 2018-07-03 LAB — APTT: APTT: 26 s (ref 24–36)

## 2018-07-03 LAB — SURGICAL PCR SCREEN
MRSA, PCR: NEGATIVE
STAPHYLOCOCCUS AUREUS: NEGATIVE

## 2018-07-03 LAB — GLUCOSE, CAPILLARY: GLUCOSE-CAPILLARY: 149 mg/dL — AB (ref 70–99)

## 2018-07-03 NOTE — Progress Notes (Addendum)
PCP - Dr. Angelina Ok Cardiologist - denies  Chest x-ray - 07/03/2018 EKG - 07/03/2018 Stress Test - 12/21/13-requested ECHO - 01/14/14-requested Cardiac Cath -denies   Sleep Study - denies  Fasting Blood Sugar - does not know.  Checks Blood Sugar approx 3 times a day at times. As of late, she has just been checking CBG as needed.   Aspirin Instructions: N/A  Medical and cardiac clearance forms in chart.   Anesthesia review: Yes, records requested from [facility].   Patient denies shortness of breath, fever, cough and chest pain at PAT appointment   Patient verbalized understanding of instructions that were given to them at the PAT appointment. Patient was also instructed that they will need to review over the PAT instructions again at home before surgery.

## 2018-07-04 LAB — HEMOGLOBIN A1C
HEMOGLOBIN A1C: 7.9 % — AB (ref 4.8–5.6)
Mean Plasma Glucose: 180 mg/dL

## 2018-07-06 NOTE — Progress Notes (Signed)
Anesthesia Chart Review:  Case:  [id number] Date/Time:  07/13/18 0715   Procedure:  RIGHT TOTAL KNEE ARTHROPLASTY (Right Knee)   Anesthesia type:  General   Pre-op diagnosis:  severe osteoarthritis right knee   Location:  [facility] OR ROOM 05 / [place] [facility]   Surgeon:  Jessy Oto, MD      DISCUSSION: 72 yo female never smoker. Pertinent hx includes PONV, DMII, HTN.   Pt was evaluated by car diology in 2015 for SOB and leg swelling. Records from [facility] reviewed. In 2015 she had an Echo showing nl systolic function EF 40-81%, mild LVH, mild AR. She also had a normal myocardial perfusion study without evidence of ischemia.   She has medical and cardiac clearance from her PCP Dr. Angelina Ok.  Anticipate she can proceed as planned barring acute status change.  VS: BP 118/62   Pulse 100   Temp 36.6 C   Resp 20   Ht 5\' 4"  (1.626 m)   Wt 97.3 kg   SpO2 100%   BMI 36.82 kg/m   PROVIDERS: Caprice Renshaw, MD is PCP  Philis Pique, MD is Cardiologist  LABS: Labs reviewed: Acceptable for surgery. Elevated A1c 7.9, result called to Dr. Otho Ket office. (all labs ordered are listed, but only abnormal results are displayed)  Labs Reviewed  GLUCOSE, CAPILLARY - Abnormal; Notable for the following components:      Result Value   Glucose-Capillary 149 (*)    All other components within normal limits  COMPREHENSIVE METABOLIC PANEL - Abnormal; Notable for the following components:   Glucose, Bld 114 (*)    Calcium 8.7 (*)    Total Protein 5.9 (*)    Albumin 3.3 (*)    All other components within normal limits  HEMOGLOBIN A1C - Abnormal; Notable for the following components:   Hgb A1c MFr Bld 7.9 (*)    All other components within normal limits  SURGICAL PCR SCREEN  APTT  CBC  PROTIME-INR  URINALYSIS, ROUTINE W REFLEX MICROSCOPIC     IMAGES: CHEST - 2 VIEW 07/03/2018  COMPARISON:  None.  FINDINGS: The heart size and mediastinal contours are within  normal limits. Both lungs are clear. The visualized skeletal structures are unremarkable.  IMPRESSION: No active cardiopulmonary disease.  EKG: 07/03/2018: NSR  CV: Myocardial perfusion 12/21/2013 (outside record, copy on pt chart): Findings: Resting EKG findings: Normal sinus rhythm.  No significant repolarization of normalities. Stress EKG findings: Normal sinus rhythm.  Appropriate blood pressure and heart rate response to stress.  No significant symptoms or ECG changes after pharmacologic stress. Rhythm: No significant dysrhythmias. MPI findings: Normal myocardial perfusion study without evidence of inducible ischemia.  Normal left ventricular wall motion and symptom function.  The post stress ejection fraction is measured at 69%.  TID ratio is 1.00  Echo 01/10/2014 (outside record, copy on pt chart): Summary: Global left ventricular systolic function is normal. The estimated left ventricular ejection fraction is 55 to 60%. There is mild concentric left ventricular hypertrophy seen. There is an abnormal left ventricular filling pattern consistent with diastolic dysfunction. Mild sclerosis of the trileaflet aortic valve, with adequate leaflet motion.  Mild aortic regurgitation is present. The estimated pulmonary artery systolic pressure is normal. Trivial mitral regurgitation is present.  Past Medical History:  Diagnosis Date  . Complication of anesthesia   . Diabetes mellitus without complication ([place])   . Hypertension   . PONV (postoperative nausea and vomiting)     Past  Surgical History:  Procedure Laterality Date  . ABDOMINAL HYSTERECTOMY    . BREAST EXCISIONAL BIOPSY Right    negative  . CATARACT EXTRACTION, BILATERAL    . EYE SURGERY    . GASTRIC BYPASS  2001    MEDICATIONS: . albuterol (PROVENTIL HFA;VENTOLIN HFA) 108 (90 Base) MCG/ACT inhaler  . cilostazol (PLETAL) 100 MG tablet  . diclofenac sodium (VOLTAREN) 1 % GEL  . dorzolamide-timolol (COSOPT) 22.3-6.8  MG/ML ophthalmic solution  . furosemide (LASIX) 80 MG tablet  . gabapentin (NEURONTIN) 100 MG capsule  . hydrochlorothiazide (HYDRODIURIL) 12.5 MG tablet  . HYDROcodone-acetaminophen (NORCO/VICODIN) 5-325 MG tablet  . latanoprost (XALATAN) 0.005 % ophthalmic solution  . linaclotide (LINZESS) 290 MCG CAPS capsule  . losartan (COZAAR) 100 MG tablet  . metformin (FORTAMET) 1000 MG (OSM) 24 hr tablet  . metoprolol succinate (TOPROL-XL) 25 MG 24 hr tablet  . montelukast (SINGULAIR) 10 MG tablet  . Multiple Vitamins-Minerals (MULTIVITAMIN WITH MINERALS) tablet  . ondansetron (ZOFRAN-ODT) 4 MG disintegrating tablet  . oxybutynin (DITROPAN) 5 MG tablet  . OZEMPIC, 0.25 OR 0.5 MG/DOSE, 2 MG/1.5ML SOPN  . pantoprazole (PROTONIX) 40 MG tablet  . phentermine (ADIPEX-P) 37.5 MG tablet  . potassium chloride SA (K-DUR,KLOR-CON) 20 MEQ tablet  . traMADol (ULTRAM) 50 MG tablet  . TRESIBA FLEXTOUCH 200 UNIT/ML SOPN  . Vitamin D, Ergocalciferol, (DRISDOL) 50000 units CAPS capsule   No current facility-administered medications for this encounter.      Wynonia Musty [facility] Short Stay Center/Anesthesiology Phone [phone] 07/06/2018 11:01 AM

## 2018-07-07 ENCOUNTER — Telehealth (INDEPENDENT_AMBULATORY_CARE_PROVIDER_SITE_OTHER): Payer: Self-pay | Admitting: Specialist

## 2018-07-07 NOTE — Telephone Encounter (Signed)
Patient she wanted to know if she had to go to [facility], Patient stated it's to far for her daughters to drive.

## 2018-07-08 NOTE — Telephone Encounter (Signed)
Please see message below.  I'm not sure who sets up where the patient goes after surgery.

## 2018-07-08 NOTE — Telephone Encounter (Signed)
I called patient and discussed.  She can go where she chooses to go post op.  She may check out [employer]

## 2018-07-12 ENCOUNTER — Encounter (HOSPITAL_COMMUNITY): Payer: Self-pay | Admitting: Anesthesiology

## 2018-07-12 NOTE — Anesthesia Preprocedure Evaluation (Addendum)
Anesthesia Evaluation  Patient identified by MRN, date of birth, ID band Patient awake    Reviewed: Allergy & Precautions, NPO status , Patient's Chart, lab work & pertinent test results  History of Anesthesia Complications (+) PONV and history of anesthetic complications  Airway Mallampati: II  TM Distance: >3 FB Neck ROM: Full    Dental  (+) Teeth Intact, Dental Advisory Given, Missing,    Pulmonary neg pulmonary ROS,    Pulmonary exam normal        Cardiovascular hypertension, Pt. on medications and Pt. on home beta blockers  Rhythm:Regular Rate:Normal     Neuro/Psych negative neurological ROS     GI/Hepatic GERD  Medicated,  Endo/Other  diabetes, Type 2, Oral Hypoglycemic Agents  Renal/GU      Musculoskeletal   Abdominal (+) + obese,   Peds  Hematology   Anesthesia Other Findings   Reproductive/Obstetrics                            Lab Results  Component Value Date   WBC 4.3 07/03/2018   HGB 12.1 07/03/2018   HCT 38.4 07/03/2018   MCV 97.0 07/03/2018   PLT 198 07/03/2018   Lab Results  Component Value Date   INR 1.04 07/03/2018   EKG: NSR  Anesthesia Physical Anesthesia Plan  ASA: II  Anesthesia Plan: Spinal   Post-op Pain Management:  Regional for Post-op pain   Induction: Intravenous  PONV Risk Score and Plan: 3 and Ondansetron, Dexamethasone and Propofol infusion  Airway Management Planned: Simple Face Mask  Additional Equipment: None  Intra-op Plan:   Post-operative Plan:   Informed Consent: I have reviewed the patients History and Physical, chart, labs and discussed the procedure including the risks, benefits and alternatives for the proposed anesthesia with the patient or authorized representative who has indicated his/her understanding and acceptance.     Plan Discussed with: CRNA  Anesthesia Plan Comments:        Anesthesia Quick  Evaluation

## 2018-07-13 ENCOUNTER — Ambulatory Visit (HOSPITAL_COMMUNITY): Payer: Medicare Other | Admitting: Anesthesiology

## 2018-07-13 ENCOUNTER — Ambulatory Visit (HOSPITAL_COMMUNITY): Payer: Medicare Other | Admitting: Physician Assistant

## 2018-07-13 ENCOUNTER — Other Ambulatory Visit: Payer: Self-pay

## 2018-07-13 ENCOUNTER — Inpatient Hospital Stay (HOSPITAL_COMMUNITY)
Admission: RE | Admit: 2018-07-13 | Discharge: 2018-07-17 | DRG: 470 | Disposition: A | Discharge status: Skilled Nursing Facility | Payer: Medicare Other | Source: Ambulatory Visit | Attending: Specialist | Admitting: Specialist

## 2018-07-13 ENCOUNTER — Encounter (HOSPITAL_COMMUNITY)
Admission: RE | Disposition: A | Discharge status: Skilled Nursing Facility | Payer: Self-pay | Source: Ambulatory Visit | Attending: Specialist

## 2018-07-13 ENCOUNTER — Encounter (HOSPITAL_COMMUNITY): Payer: Self-pay | Admitting: *Deleted

## 2018-07-13 DIAGNOSIS — I739 Peripheral vascular disease, unspecified: Secondary | ICD-10-CM | POA: Insufficient documentation

## 2018-07-13 DIAGNOSIS — E1151 Type 2 diabetes mellitus with diabetic peripheral angiopathy without gangrene: Secondary | ICD-10-CM | POA: Diagnosis present

## 2018-07-13 DIAGNOSIS — Z9884 Bariatric surgery status: Secondary | ICD-10-CM

## 2018-07-13 DIAGNOSIS — I351 Nonrheumatic aortic (valve) insufficiency: Secondary | ICD-10-CM | POA: Diagnosis not present

## 2018-07-13 DIAGNOSIS — Z96651 Presence of right artificial knee joint: Secondary | ICD-10-CM

## 2018-07-13 DIAGNOSIS — Z885 Allergy status to narcotic agent status: Secondary | ICD-10-CM

## 2018-07-13 DIAGNOSIS — E1165 Type 2 diabetes mellitus with hyperglycemia: Secondary | ICD-10-CM | POA: Diagnosis present

## 2018-07-13 DIAGNOSIS — Z888 Allergy status to other drugs, medicaments and biological substances status: Secondary | ICD-10-CM

## 2018-07-13 DIAGNOSIS — Z7984 Long term (current) use of oral hypoglycemic drugs: Secondary | ICD-10-CM

## 2018-07-13 DIAGNOSIS — D62 Acute posthemorrhagic anemia: Secondary | ICD-10-CM | POA: Diagnosis not present

## 2018-07-13 DIAGNOSIS — I361 Nonrheumatic tricuspid (valve) insufficiency: Secondary | ICD-10-CM | POA: Diagnosis not present

## 2018-07-13 DIAGNOSIS — Z79899 Other long term (current) drug therapy: Secondary | ICD-10-CM | POA: Diagnosis not present

## 2018-07-13 DIAGNOSIS — R000 Tachycardia, unspecified: Secondary | ICD-10-CM | POA: Diagnosis present

## 2018-07-13 DIAGNOSIS — I10 Essential (primary) hypertension: Secondary | ICD-10-CM | POA: Diagnosis present

## 2018-07-13 DIAGNOSIS — M1711 Unilateral primary osteoarthritis, right knee: Secondary | ICD-10-CM | POA: Diagnosis present

## 2018-07-13 HISTORY — DX: Peripheral vascular disease, unspecified: I73.9

## 2018-07-13 HISTORY — DX: Unspecified osteoarthritis, unspecified site: M19.90

## 2018-07-13 HISTORY — PX: TOTAL KNEE ARTHROPLASTY: SHX125

## 2018-07-13 LAB — GLUCOSE, CAPILLARY
GLUCOSE-CAPILLARY: 94 mg/dL (ref 70–99)
GLUCOSE-CAPILLARY: 169 mg/dL — AB (ref 70–99)
GLUCOSE-CAPILLARY: 188 mg/dL — AB (ref 70–99)
Glucose-Capillary: 80 mg/dL (ref 70–99)

## 2018-07-13 LAB — HEMOGLOBIN A1C
Hgb A1c MFr Bld: 7.8 % — ABNORMAL HIGH (ref 4.8–5.6)
Mean Plasma Glucose: 177.16 mg/dL

## 2018-07-13 SURGERY — ARTHROPLASTY, KNEE, TOTAL
Anesthesia: Spinal | Site: Knee | Laterality: Right

## 2018-07-13 MED ORDER — BUPIVACAINE LIPOSOME 1.3 % IJ SUSP
20.0000 mL | INTRAMUSCULAR | Status: AC
  Administered 2018-07-13: 20 mL
  Filled 2018-07-13: qty 20

## 2018-07-13 MED ORDER — BUPIVACAINE HCL 0.5 % IJ SOLN
INTRAMUSCULAR | Status: DC | PRN
  Administered 2018-07-13: 20 mL

## 2018-07-13 MED ORDER — HYDROCHLOROTHIAZIDE 25 MG PO TABS
12.5000 mg | ORAL_TABLET | Freq: Every day | ORAL | Status: DC
  Administered 2018-07-13 – 2018-07-16 (×4): 12.5 mg via ORAL
  Filled 2018-07-13 (×4): qty 1

## 2018-07-13 MED ORDER — METOPROLOL SUCCINATE ER 25 MG PO TB24
25.0000 mg | ORAL_TABLET | Freq: Every day | ORAL | Status: DC
  Administered 2018-07-14 – 2018-07-15 (×2): 25 mg via ORAL
  Filled 2018-07-13 (×2): qty 1

## 2018-07-13 MED ORDER — PHENYLEPHRINE 40 MCG/ML (10ML) SYRINGE FOR IV PUSH (FOR BLOOD PRESSURE SUPPORT)
PREFILLED_SYRINGE | INTRAVENOUS | Status: DC | PRN
  Administered 2018-07-13 (×3): 80 ug via INTRAVENOUS

## 2018-07-13 MED ORDER — SODIUM CHLORIDE 0.9 % IV SOLN
INTRAVENOUS | Status: DC
  Administered 2018-07-13 – 2018-07-14 (×2): via INTRAVENOUS

## 2018-07-13 MED ORDER — MONTELUKAST SODIUM 10 MG PO TABS
10.0000 mg | ORAL_TABLET | ORAL | Status: DC
  Administered 2018-07-14 – 2018-07-17 (×4): 10 mg via ORAL
  Filled 2018-07-13 (×4): qty 1

## 2018-07-13 MED ORDER — ASPIRIN EC 325 MG PO TBEC: 325.0000 mg | DELAYED_RELEASE_TABLET | Freq: Every day | ORAL | Status: DC

## 2018-07-13 MED ORDER — LATANOPROST 0.005 % OP SOLN
1.0000 [drp] | Freq: Every day | OPHTHALMIC | Status: DC
  Administered 2018-07-13 – 2018-07-16 (×4): 1 [drp] via OPHTHALMIC
  Filled 2018-07-13: qty 2.5

## 2018-07-13 MED ORDER — HYDROCODONE-ACETAMINOPHEN 7.5-325 MG PO TABS: 1.0000 | ORAL_TABLET | ORAL | Status: DC | PRN

## 2018-07-13 MED ORDER — ADULT MULTIVITAMIN W/MINERALS CH
1.0000 | ORAL_TABLET | Freq: Every day | ORAL | Status: DC
  Administered 2018-07-13 – 2018-07-17 (×5): 1 via ORAL
  Filled 2018-07-13 (×5): qty 1

## 2018-07-13 MED ORDER — FENTANYL CITRATE (PF) 250 MCG/5ML IJ SOLN
INTRAMUSCULAR | Status: AC
  Filled 2018-07-13: qty 5

## 2018-07-13 MED ORDER — DORZOLAMIDE HCL-TIMOLOL MAL 2-0.5 % OP SOLN
1.0000 [drp] | Freq: Two times a day (BID) | OPHTHALMIC | Status: DC
  Administered 2018-07-13 – 2018-07-17 (×8): 1 [drp] via OPHTHALMIC
  Filled 2018-07-13: qty 10

## 2018-07-13 MED ORDER — MIDAZOLAM HCL 2 MG/2ML IJ SOLN
INTRAMUSCULAR | Status: AC
  Filled 2018-07-13: qty 2

## 2018-07-13 MED ORDER — PANTOPRAZOLE SODIUM 40 MG PO TBEC: 40.0000 mg | DELAYED_RELEASE_TABLET | Freq: Every day | ORAL | Status: DC

## 2018-07-13 MED ORDER — LINACLOTIDE 145 MCG PO CAPS
290.0000 ug | ORAL_CAPSULE | Freq: Every day | ORAL | Status: DC
  Administered 2018-07-14 – 2018-07-17 (×4): 290 ug via ORAL
  Filled 2018-07-13 (×4): qty 2

## 2018-07-13 MED ORDER — VITAMIN D (ERGOCALCIFEROL) 1.25 MG (50000 UNIT) PO CAPS
50000.0000 [IU] | ORAL_CAPSULE | ORAL | Status: DC
  Administered 2018-07-14: 50000 [IU] via ORAL
  Filled 2018-07-13: qty 1

## 2018-07-13 MED ORDER — PROPOFOL 10 MG/ML IV BOLUS
INTRAVENOUS | Status: DC | PRN
  Administered 2018-07-13: 20 mg via INTRAVENOUS
  Administered 2018-07-13: 10 mg via INTRAVENOUS
  Administered 2018-07-13 (×2): 20 mg via INTRAVENOUS

## 2018-07-13 MED ORDER — LACTATED RINGERS IV SOLN: INTRAVENOUS | Status: DC

## 2018-07-13 MED ORDER — DOCUSATE SODIUM 100 MG PO CAPS
100.0000 mg | ORAL_CAPSULE | Freq: Two times a day (BID) | ORAL | Status: DC
  Administered 2018-07-13 – 2018-07-16 (×7): 100 mg via ORAL
  Filled 2018-07-13 (×7): qty 1

## 2018-07-13 MED ORDER — DEXMEDETOMIDINE HCL 200 MCG/2ML IV SOLN
INTRAVENOUS | Status: DC | PRN
  Administered 2018-07-13 (×3): 4 ug via INTRAVENOUS
  Administered 2018-07-13: 8 ug via INTRAVENOUS

## 2018-07-13 MED ORDER — PHENOL 1.4 % MT LIQD: 1.0000 | OROMUCOSAL | Status: DC | PRN

## 2018-07-13 MED ORDER — DIPHENHYDRAMINE HCL 12.5 MG/5ML PO ELIX: 12.5000 mg | ORAL_SOLUTION | ORAL | Status: DC | PRN

## 2018-07-13 MED ORDER — CILOSTAZOL 100 MG PO TABS
100.0000 mg | ORAL_TABLET | Freq: Two times a day (BID) | ORAL | Status: DC
  Administered 2018-07-13 – 2018-07-17 (×8): 100 mg via ORAL
  Filled 2018-07-13 (×10): qty 1

## 2018-07-13 MED ORDER — ALBUTEROL SULFATE (2.5 MG/3ML) 0.083% IN NEBU: 2.5000 mg | INHALATION_SOLUTION | RESPIRATORY_TRACT | Status: DC | PRN

## 2018-07-13 MED ORDER — OXYBUTYNIN CHLORIDE 5 MG PO TABS
5.0000 mg | ORAL_TABLET | Freq: Every day | ORAL | Status: DC
  Administered 2018-07-14 – 2018-07-17 (×4): 5 mg via ORAL
  Filled 2018-07-13 (×4): qty 1

## 2018-07-13 MED ORDER — BISACODYL 5 MG PO TBEC
5.0000 mg | DELAYED_RELEASE_TABLET | Freq: Every day | ORAL | Status: DC | PRN
  Administered 2018-07-16: 5 mg via ORAL
  Filled 2018-07-13: qty 1

## 2018-07-13 MED ORDER — MEPERIDINE HCL 50 MG/ML IJ SOLN: 6.2500 mg | INTRAMUSCULAR | Status: DC | PRN

## 2018-07-13 MED ORDER — BUPIVACAINE HCL 0.5 % IJ SOLN
INTRAMUSCULAR | Status: AC
  Filled 2018-07-13: qty 1

## 2018-07-13 MED ORDER — BUPIVACAINE IN DEXTROSE 0.75-8.25 % IT SOLN
INTRATHECAL | Status: DC | PRN
  Administered 2018-07-13: 1.8 mL via INTRATHECAL

## 2018-07-13 MED ORDER — PHENTERMINE HCL 37.5 MG PO TABS: 37.5000 mg | ORAL_TABLET | Freq: Every day | ORAL | Status: DC

## 2018-07-13 MED ORDER — ACETAMINOPHEN 325 MG PO TABS: 325.0000 mg | ORAL_TABLET | Freq: Four times a day (QID) | ORAL | Status: DC | PRN

## 2018-07-13 MED ORDER — GABAPENTIN 100 MG PO CAPS
100.0000 mg | ORAL_CAPSULE | Freq: Every day | ORAL | Status: DC
  Administered 2018-07-13 – 2018-07-16 (×4): 100 mg via ORAL
  Filled 2018-07-13 (×4): qty 1

## 2018-07-13 MED ORDER — ONDANSETRON HCL 4 MG/2ML IJ SOLN
INTRAMUSCULAR | Status: DC | PRN
  Administered 2018-07-13: 4 mg via INTRAVENOUS

## 2018-07-13 MED ORDER — METOCLOPRAMIDE HCL 5 MG PO TABS: 5.0000 mg | ORAL_TABLET | Freq: Three times a day (TID) | ORAL | Status: DC | PRN

## 2018-07-13 MED ORDER — CHLORHEXIDINE GLUCONATE 4 % EX LIQD: 60.0000 mL | Freq: Once | CUTANEOUS | Status: DC

## 2018-07-13 MED ORDER — LACTATED RINGERS IV SOLN
INTRAVENOUS | Status: DC | PRN
  Administered 2018-07-13: 07:00:00 via INTRAVENOUS

## 2018-07-13 MED ORDER — PROPOFOL 500 MG/50ML IV EMUL
INTRAVENOUS | Status: DC | PRN
  Administered 2018-07-13: 75 ug/kg/min via INTRAVENOUS

## 2018-07-13 MED ORDER — ASPIRIN EC 81 MG PO TBEC
81.0000 mg | DELAYED_RELEASE_TABLET | Freq: Two times a day (BID) | ORAL | Status: DC
  Administered 2018-07-14 – 2018-07-17 (×7): 81 mg via ORAL
  Filled 2018-07-13 (×7): qty 1

## 2018-07-13 MED ORDER — FLEET ENEMA 7-19 GM/118ML RE ENEM
1.0000 | ENEMA | Freq: Once | RECTAL | Status: AC | PRN
  Administered 2018-07-16: 1 via RECTAL
  Filled 2018-07-13: qty 1

## 2018-07-13 MED ORDER — HYDROCODONE-ACETAMINOPHEN 5-325 MG PO TABS
1.0000 | ORAL_TABLET | ORAL | Status: DC | PRN
  Administered 2018-07-13 (×2): 2 via ORAL
  Administered 2018-07-14: 1 via ORAL
  Administered 2018-07-14 – 2018-07-15 (×2): 2 via ORAL
  Administered 2018-07-15 – 2018-07-16 (×4): 1 via ORAL
  Administered 2018-07-17 (×3): 2 via ORAL
  Filled 2018-07-13: qty 2
  Filled 2018-07-13: qty 1
  Filled 2018-07-13 (×5): qty 2
  Filled 2018-07-13: qty 1
  Filled 2018-07-13 (×2): qty 2
  Filled 2018-07-13 (×2): qty 1

## 2018-07-13 MED ORDER — MENTHOL 3 MG MT LOZG: 1.0000 | LOZENGE | OROMUCOSAL | Status: DC | PRN

## 2018-07-13 MED ORDER — TRANEXAMIC ACID 1000 MG/10ML IV SOLN
1.5000 mg/kg/h | INTRAVENOUS | Status: DC
  Filled 2018-07-13: qty 10

## 2018-07-13 MED ORDER — INSULIN ASPART 100 UNIT/ML ~~LOC~~ SOLN
0.0000 [IU] | Freq: Three times a day (TID) | SUBCUTANEOUS | Status: DC
  Administered 2018-07-13: 3 [IU] via SUBCUTANEOUS
  Administered 2018-07-14: 5 [IU] via SUBCUTANEOUS
  Administered 2018-07-14: 8 [IU] via SUBCUTANEOUS
  Administered 2018-07-14: 5 [IU] via SUBCUTANEOUS
  Administered 2018-07-15 (×2): 3 [IU] via SUBCUTANEOUS
  Administered 2018-07-15: 2 [IU] via SUBCUTANEOUS
  Administered 2018-07-16: 3 [IU] via SUBCUTANEOUS
  Administered 2018-07-16 – 2018-07-17 (×3): 2 [IU] via SUBCUTANEOUS

## 2018-07-13 MED ORDER — ALBUTEROL SULFATE HFA 108 (90 BASE) MCG/ACT IN AERS: 1.0000 | INHALATION_SPRAY | RESPIRATORY_TRACT | Status: DC | PRN

## 2018-07-13 MED ORDER — CEFAZOLIN SODIUM-DEXTROSE 2-4 GM/100ML-% IV SOLN
2.0000 g | INTRAVENOUS | Status: AC
  Administered 2018-07-13: 2 g via INTRAVENOUS
  Filled 2018-07-13: qty 100

## 2018-07-13 MED ORDER — FENTANYL CITRATE (PF) 100 MCG/2ML IJ SOLN
25.0000 ug | INTRAMUSCULAR | Status: DC | PRN
  Administered 2018-07-13: 50 ug via INTRAVENOUS

## 2018-07-13 MED ORDER — MORPHINE SULFATE (PF) 2 MG/ML IV SOLN
0.5000 mg | INTRAVENOUS | Status: DC | PRN
  Filled 2018-07-13: qty 1

## 2018-07-13 MED ORDER — PROMETHAZINE HCL 25 MG/ML IJ SOLN: 6.2500 mg | INTRAMUSCULAR | Status: DC | PRN

## 2018-07-13 MED ORDER — FUROSEMIDE 80 MG PO TABS
80.0000 mg | ORAL_TABLET | Freq: Every day | ORAL | Status: DC | PRN
  Filled 2018-07-13: qty 1

## 2018-07-13 MED ORDER — PANTOPRAZOLE SODIUM 40 MG PO TBEC
40.0000 mg | DELAYED_RELEASE_TABLET | Freq: Every day | ORAL | Status: DC
  Administered 2018-07-13 – 2018-07-17 (×5): 40 mg via ORAL
  Filled 2018-07-13 (×5): qty 1

## 2018-07-13 MED ORDER — TRAMADOL HCL 50 MG PO TABS
50.0000 mg | ORAL_TABLET | Freq: Four times a day (QID) | ORAL | Status: DC
  Administered 2018-07-13 – 2018-07-17 (×16): 50 mg via ORAL
  Filled 2018-07-13 (×18): qty 1

## 2018-07-13 MED ORDER — METHOCARBAMOL 500 MG PO TABS
500.0000 mg | ORAL_TABLET | Freq: Four times a day (QID) | ORAL | Status: DC | PRN
  Administered 2018-07-14 – 2018-07-16 (×6): 500 mg via ORAL
  Filled 2018-07-13 (×6): qty 1

## 2018-07-13 MED ORDER — FENTANYL CITRATE (PF) 100 MCG/2ML IJ SOLN
INTRAMUSCULAR | Status: AC
  Filled 2018-07-13: qty 2

## 2018-07-13 MED ORDER — FERROUS SULFATE 325 (65 FE) MG PO TABS
325.0000 mg | ORAL_TABLET | Freq: Two times a day (BID) | ORAL | Status: DC
  Administered 2018-07-13 – 2018-07-16 (×7): 325 mg via ORAL
  Filled 2018-07-13 (×8): qty 1

## 2018-07-13 MED ORDER — ALUMINUM HYDROXIDE GEL 320 MG/5ML PO SUSP
15.0000 mL | ORAL | Status: DC | PRN
  Administered 2018-07-16: 30 mL via ORAL
  Filled 2018-07-13 (×2): qty 30

## 2018-07-13 MED ORDER — POTASSIUM CHLORIDE CRYS ER 20 MEQ PO TBCR
20.0000 meq | EXTENDED_RELEASE_TABLET | Freq: Two times a day (BID) | ORAL | Status: DC
  Administered 2018-07-13 – 2018-07-17 (×9): 20 meq via ORAL
  Filled 2018-07-13 (×9): qty 1

## 2018-07-13 MED ORDER — LOSARTAN POTASSIUM 50 MG PO TABS
100.0000 mg | ORAL_TABLET | Freq: Every day | ORAL | Status: DC
  Administered 2018-07-13 – 2018-07-16 (×4): 100 mg via ORAL
  Filled 2018-07-13 (×5): qty 2

## 2018-07-13 MED ORDER — SODIUM CHLORIDE 0.9 % IR SOLN
Status: DC | PRN
  Administered 2018-07-13: 3000 mL

## 2018-07-13 MED ORDER — ROPIVACAINE HCL 5 MG/ML IJ SOLN
INTRAMUSCULAR | Status: DC | PRN
  Administered 2018-07-13: 30 mL via PERINEURAL

## 2018-07-13 MED ORDER — METOCLOPRAMIDE HCL 5 MG/ML IJ SOLN
5.0000 mg | Freq: Three times a day (TID) | INTRAMUSCULAR | Status: DC | PRN
  Administered 2018-07-13: 10 mg via INTRAVENOUS
  Filled 2018-07-13: qty 2

## 2018-07-13 MED ORDER — SODIUM CHLORIDE 0.9 % IV SOLN
INTRAVENOUS | Status: DC | PRN
  Administered 2018-07-13: 30 ug/min via INTRAVENOUS

## 2018-07-13 MED ORDER — INSULIN GLARGINE 100 UNIT/ML ~~LOC~~ SOLN
20.0000 [IU] | Freq: Every day | SUBCUTANEOUS | Status: DC
  Administered 2018-07-13 – 2018-07-14 (×2): 20 [IU] via SUBCUTANEOUS
  Filled 2018-07-13 (×2): qty 0.2

## 2018-07-13 MED ORDER — METHOCARBAMOL 1000 MG/10ML IJ SOLN
500.0000 mg | Freq: Four times a day (QID) | INTRAVENOUS | Status: DC | PRN
  Filled 2018-07-13: qty 5

## 2018-07-13 MED ORDER — POLYETHYLENE GLYCOL 3350 17 G PO PACK
17.0000 g | PACK | Freq: Every day | ORAL | Status: DC | PRN
  Administered 2018-07-16: 17 g via ORAL
  Filled 2018-07-13: qty 1

## 2018-07-13 MED ORDER — LIDOCAINE 2% (20 MG/ML) 5 ML SYRINGE
INTRAMUSCULAR | Status: AC
  Filled 2018-07-13: qty 5

## 2018-07-13 MED ORDER — INSULIN DEGLUDEC 200 UNIT/ML ~~LOC~~ SOPN: 20.0000 [IU] | PEN_INJECTOR | Freq: Every day | SUBCUTANEOUS | Status: DC

## 2018-07-13 MED ORDER — TRANEXAMIC ACID 1000 MG/10ML IV SOLN
INTRAVENOUS | Status: DC | PRN
  Administered 2018-07-13: 1000 mg via INTRAVENOUS

## 2018-07-13 MED ORDER — ONDANSETRON 4 MG PO TBDP
4.0000 mg | ORAL_TABLET | ORAL | Status: DC | PRN
  Administered 2018-07-14 – 2018-07-17 (×2): 4 mg via ORAL
  Filled 2018-07-13 (×2): qty 1

## 2018-07-13 SURGICAL SUPPLY — 77 items
ATTUNE PS FEM RT SZ 5 CEM KNEE (Femur) ×2 IMPLANT
ATTUNE PSRP INSR SZ5 5 KNEE (Insert) ×2 IMPLANT
BANDAGE ACE 4X5 VEL STRL LF (GAUZE/BANDAGES/DRESSINGS) ×2 IMPLANT
BANDAGE ACE 6X5 VEL STRL LF (GAUZE/BANDAGES/DRESSINGS) ×2 IMPLANT
BANDAGE ESMARK 6X9 LF (GAUZE/BANDAGES/DRESSINGS) ×1 IMPLANT
BASE TIBIAL ROT PLAT SZ 5 KNEE (Knees) ×1 IMPLANT
BENZOIN TINCTURE PRP APPL 2/3 (GAUZE/BANDAGES/DRESSINGS) IMPLANT
BLADE SAG 18X100X1.27 (BLADE) ×4 IMPLANT
BLADE SAW SGTL 13X75X1.27 (BLADE) ×2 IMPLANT
BNDG ELASTIC 6X10 VLCR STRL LF (GAUZE/BANDAGES/DRESSINGS) ×2 IMPLANT
BNDG ESMARK 6X9 LF (GAUZE/BANDAGES/DRESSINGS) ×2
BOWL SMART MIX CTS (DISPOSABLE) ×2 IMPLANT
CEMENT HV SMART SET (Cement) ×4 IMPLANT
COVER SURGICAL LIGHT HANDLE (MISCELLANEOUS) ×2 IMPLANT
COVER WAND RF STERILE (DRAPES) ×2 IMPLANT
CUFF TOURNIQUET SINGLE 34IN LL (TOURNIQUET CUFF) ×2 IMPLANT
CUFF TOURNIQUET SINGLE 44IN (TOURNIQUET CUFF) IMPLANT
DERMABOND ADHESIVE PROPEN (GAUZE/BANDAGES/DRESSINGS) ×1
DERMABOND ADVANCED .7 DNX6 (GAUZE/BANDAGES/DRESSINGS) ×1 IMPLANT
DRAPE ORTHO SPLIT 77X108 STRL (DRAPES) ×2
DRAPE SURG ORHT 6 SPLT 77X108 (DRAPES) ×2 IMPLANT
DRAPE U-SHAPE 47X51 STRL (DRAPES) ×2 IMPLANT
DRSG ADAPTIC 3X8 NADH LF (GAUZE/BANDAGES/DRESSINGS) IMPLANT
DRSG PAD ABDOMINAL 8X10 ST (GAUZE/BANDAGES/DRESSINGS) ×2 IMPLANT
DURAPREP 26ML APPLICATOR (WOUND CARE) ×2 IMPLANT
ELECT REM PT RETURN 9FT ADLT (ELECTROSURGICAL) ×2
ELECTRODE REM PT RTRN 9FT ADLT (ELECTROSURGICAL) ×1 IMPLANT
EVACUATOR 1/8 PVC DRAIN (DRAIN) IMPLANT
FACESHIELD WRAPAROUND (MASK) ×4 IMPLANT
GAUZE SPONGE 4X4 12PLY STRL (GAUZE/BANDAGES/DRESSINGS) ×2 IMPLANT
GLOVE BIOGEL PI IND STRL 8 (GLOVE) IMPLANT
GLOVE BIOGEL PI INDICATOR 8 (GLOVE)
GLOVE ECLIPSE 9.0 STRL (GLOVE) ×2 IMPLANT
GLOVE ORTHO TXT STRL SZ7.5 (GLOVE) IMPLANT
GLOVE SURG 8.5 LATEX PF (GLOVE) ×6 IMPLANT
GOWN STRL REUS W/ TWL LRG LVL3 (GOWN DISPOSABLE) ×2 IMPLANT
GOWN STRL REUS W/TWL 2XL LVL3 (GOWN DISPOSABLE) ×2 IMPLANT
GOWN STRL REUS W/TWL LRG LVL3 (GOWN DISPOSABLE) ×2
HANDPIECE INTERPULSE COAX TIP (DISPOSABLE) ×1
IMMOBILIZER KNEE 20 (SOFTGOODS) ×2
IMMOBILIZER KNEE 20 THIGH 36 (SOFTGOODS) ×1 IMPLANT
IMMOBILIZER KNEE 22 UNIV (SOFTGOODS) IMPLANT
KIT BASIN OR (CUSTOM PROCEDURE TRAY) ×2 IMPLANT
KIT TURNOVER KIT B (KITS) ×2 IMPLANT
MANIFOLD NEPTUNE II (INSTRUMENTS) ×2 IMPLANT
NEEDLE HYPO 25X1 1.5 SAFETY (NEEDLE) IMPLANT
NS IRRIG 1000ML POUR BTL (IV SOLUTION) ×2 IMPLANT
PACK TOTAL JOINT (CUSTOM PROCEDURE TRAY) ×2 IMPLANT
PAD ABD 8X10 STRL (GAUZE/BANDAGES/DRESSINGS) ×2 IMPLANT
PAD ARMBOARD 7.5X6 YLW CONV (MISCELLANEOUS) ×2 IMPLANT
PAD CAST 4YDX4 CTTN HI CHSV (CAST SUPPLIES) ×1 IMPLANT
PADDING CAST COTTON 4X4 STRL (CAST SUPPLIES) ×1
PADDING CAST COTTON 6X4 STRL (CAST SUPPLIES) ×2 IMPLANT
PATELLA MEDIAL ATTUN 35MM KNEE (Knees) ×2 IMPLANT
PIN STEINMAN FIXATION KNEE (PIN) ×2 IMPLANT
PIN THREADED HEADED SIGMA (PIN) ×2 IMPLANT
SET HNDPC FAN SPRY TIP SCT (DISPOSABLE) ×1 IMPLANT
STAPLER VISISTAT 35W (STAPLE) ×2 IMPLANT
STRIP CLOSURE SKIN 1/2X4 (GAUZE/BANDAGES/DRESSINGS) IMPLANT
SUCTION FRAZIER HANDLE 10FR (MISCELLANEOUS)
SUCTION TUBE FRAZIER 10FR DISP (MISCELLANEOUS) IMPLANT
SUT BONE WAX W31G (SUTURE) IMPLANT
SUT VIC AB 0 CT1 27 (SUTURE) ×4
SUT VIC AB 0 CT1 27XBRD ANBCTR (SUTURE) ×4 IMPLANT
SUT VIC AB 1 CT1 27 (SUTURE) ×3
SUT VIC AB 1 CT1 27XBRD ANBCTR (SUTURE) ×3 IMPLANT
SUT VIC AB 2-0 CT1 27 (SUTURE) ×3
SUT VIC AB 2-0 CT1 TAPERPNT 27 (SUTURE) ×3 IMPLANT
SUT VICRYL 4-0 PS2 18IN ABS (SUTURE) ×2 IMPLANT
SYR CONTROL 10ML LL (SYRINGE) IMPLANT
TIBIAL BASE ROT PLAT SZ 5 KNEE (Knees) ×2 IMPLANT
TOWEL OR 17X24 6PK STRL BLUE (TOWEL DISPOSABLE) ×2 IMPLANT
TOWEL OR 17X26 10 PK STRL BLUE (TOWEL DISPOSABLE) ×2 IMPLANT
TRAY CATH 16FR W/PLASTIC CATH (SET/KITS/TRAYS/PACK) ×2 IMPLANT
TRAY FOLEY CATH SILVER 16FR (SET/KITS/TRAYS/PACK) IMPLANT
WATER STERILE IRR 1000ML POUR (IV SOLUTION) ×2 IMPLANT
WRAP KNEE MAXI GEL POST OP (GAUZE/BANDAGES/DRESSINGS) ×2 IMPLANT

## 2018-07-13 NOTE — Discharge Instructions (Addendum)
° ° °Keep knee incision dry for 5 days post op then may wet while bathing. °Therapy daily and CPM goal full extension and greater than 90 degrees flexion. °Call if fever or chills or increased drainage. °Go to ER if acutely short of breath or call for ambulance. °Return for follow up in 2 weeks. °May full weight bear on the surgical leg unless told otherwise. °Use knee immobilizer until able to straight leg raise off bed with knee stable. °In house walking for first 2 weeks. °INSTRUCTIONS AFTER JOINT REPLACEMENT  ° °o Remove items at home which could result in a fall. This includes throw rugs or furniture in walking pathways °o ICE to the affected joint every three hours while awake for 30 minutes at a time, for at least the first 3-5 days, and then as needed for pain and swelling.  Continue to use ice for pain and swelling. You may notice swelling that will progress down to the foot and ankle.  This is normal after surgery.  Elevate your leg when you are not up walking on it.   °o Continue to use the breathing machine you got in the hospital (incentive spirometer) which will help keep your temperature down.  It is common for your temperature to cycle up and down following surgery, especially at night when you are not up moving around and exerting yourself.  The breathing machine keeps your lungs expanded and your temperature down. ° ° °DIET:  As you were doing prior to hospitalization, we recommend a well-balanced diet. ° °DRESSING / WOUND CARE / SHOWERING ° °You may change your dressing 3-5 days after surgery.  Then change the dressing every day with sterile gauze.  Please use good hand washing techniques before changing the dressing.  Do not use any lotions or creams on the incision until instructed by your surgeon. ° °ACTIVITY ° °o Increase activity slowly as tolerated, but follow the weight bearing instructions below.   °o No driving for 6 weeks or until further direction given by your physician.  You cannot  drive while taking narcotics.  °o No lifting or carrying greater than 10 lbs. until further directed by your surgeon. °o Avoid periods of inactivity such as sitting longer than an hour when not asleep. This helps prevent blood clots.  °o You may return to work once you are authorized by your doctor.  ° ° ° °WEIGHT BEARING  ° °Weight bearing as tolerated with assist device (walker, cane, etc) as directed, use it as long as suggested by your surgeon or therapist, typically at least 4-6 weeks. ° ° °EXERCISES ° °Results after joint replacement surgery are often greatly improved when you follow the exercise, range of motion and muscle strengthening exercises prescribed by your doctor. Safety measures are also important to protect the joint from further injury. Any time any of these exercises cause you to have increased pain or swelling, decrease what you are doing until you are comfortable again and then slowly increase them. If you have problems or questions, call your caregiver or physical therapist for advice.  ° °Rehabilitation is important following a joint replacement. After just a few days of immobilization, the muscles of the leg can become weakened and shrink (atrophy).  These exercises are designed to build up the tone and strength of the thigh and leg muscles and to improve motion. Often times heat used for twenty to thirty minutes before working out will loosen up your tissues and help with improving the range   of motion but do not use heat for the first two weeks following surgery (sometimes heat can increase post-operative swelling).  ° °These exercises can be done on a training (exercise) mat, on the floor, on a table or on a bed. Use whatever works the best and is most comfortable for you.    Use music or television while you are exercising so that the exercises are a pleasant break in your day. This will make your life better with the exercises acting as a break in your routine that you can look forward  to.   Perform all exercises about fifteen times, three times per day or as directed.  You should exercise both the operative leg and the other leg as well. ° °Exercises include: °  °• Quad Sets - Tighten up the muscle on the front of the thigh (Quad) and hold for 5-10 seconds.   °• Straight Leg Raises - With your knee straight (if you were given a brace, keep it on), lift the leg to 60 degrees, hold for 3 seconds, and slowly lower the leg.  Perform this exercise against resistance later as your leg gets stronger.  °• Leg Slides: Lying on your back, slowly slide your foot toward your buttocks, bending your knee up off the floor (only go as far as is comfortable). Then slowly slide your foot back down until your leg is flat on the floor again.  °• Angel Wings: Lying on your back spread your legs to the side as far apart as you can without causing discomfort.  °• Hamstring Strength:  Lying on your back, push your heel against the floor with your leg straight by tightening up the muscles of your buttocks.  Repeat, but this time bend your knee to a comfortable angle, and push your heel against the floor.  You may put a pillow under the heel to make it more comfortable if necessary.  ° °A rehabilitation program following joint replacement surgery can speed recovery and prevent re-injury in the future due to weakened muscles. Contact your doctor or a physical therapist for more information on knee rehabilitation.  ° ° °CONSTIPATION ° °Constipation is defined medically as fewer than three stools per week and severe constipation as less than one stool per week.  Even if you have a regular bowel pattern at home, your normal regimen is likely to be disrupted due to multiple reasons following surgery.  Combination of anesthesia, postoperative narcotics, change in appetite and fluid intake all can affect your bowels.  ° °YOU MUST use at least one of the following options; they are listed in order of increasing strength to get  the job done.  They are all available over the counter, and you may need to use some, POSSIBLY even all of these options:   ° °Drink plenty of fluids (prune juice may be helpful) and high fiber foods °Colace 100 mg by mouth twice a day  °Senokot for constipation as directed and as needed Dulcolax (bisacodyl), take with full glass of water  °Miralax (polyethylene glycol) once or twice a day as needed. ° °If you have tried all these things and are unable to have a bowel movement in the first 3-4 days after surgery call either your surgeon or your primary doctor.   ° °If you experience loose stools or diarrhea, hold the medications until you stool forms back up.  If your symptoms do not get better within 1 week or if they get worse, check with your   doctor.  If you experience "the worst abdominal pain ever" or develop nausea or vomiting, please contact the office immediately for further recommendations for treatment. ° ° °ITCHING:  If you experience itching with your medications, try taking only a single pain pill, or even half a pain pill at a time.  You can also use Benadryl over the counter for itching or also to help with sleep.  ° °TED HOSE STOCKINGS:  Use stockings on both legs until for at least 2 weeks or as directed by physician office. They may be removed at night for sleeping. ° °MEDICATIONS:  See your medication summary on the “After Visit Summary” that nursing will review with you.  You may have some home medications which will be placed on hold until you complete the course of blood thinner medication.  It is important for you to complete the blood thinner medication as prescribed. ° °PRECAUTIONS:  If you experience chest pain or shortness of breath - call 911 immediately for transfer to the hospital emergency department.  ° °If you develop a fever greater that 101 F, purulent drainage from wound, increased redness or drainage from wound, foul odor from the wound/dressing, or calf pain - CONTACT YOUR  SURGEON.   °                                                °FOLLOW-UP APPOINTMENTS:  If you do not already have a post-op appointment, please call the office for an appointment to be seen by your surgeon.  Guidelines for how soon to be seen are listed in your “After Visit Summary”, but are typically between 1-4 weeks after surgery. ° °OTHER INSTRUCTIONS:  ° °Knee Replacement:  Do not place pillow under knee, focus on keeping the knee straight while resting. CPM instructions: 0-90 degrees, 2 hours in the morning, 2 hours in the afternoon, and 2 hours in the evening. Place foam block, curve side up under heel at all times except when in CPM or when walking.  DO NOT modify, tear, cut, or change the foam block in any way. ° °MAKE SURE YOU:  °• Understand these instructions.  °• Get help right away if you are not doing well or get worse.  ° ° °Thank you for letting us be a part of your medical care team.  It is a privilege we respect greatly.  We hope these instructions will help you stay on track for a fast and full recovery!  ° °

## 2018-07-13 NOTE — Progress Notes (Signed)
Pt transferred to rm, bed in lowest position, call bell in reach, RN at bedside, family at bedside.  Rowe Pavy, RN

## 2018-07-13 NOTE — H&P (Signed)
PREOPERATIVE H&P  Chief Complaint: severe osteoarthritis right knee  HPI: Alexandra Tran is a 72 y.o. female who presents for preoperative history and physical with a diagnosis of severe osteoarthritis right knee. Symptoms are rated as moderate to severe, and have been worsening.  This is significantly impairing activities of daily living.  She has elected for surgical management.   Past Medical History:  Diagnosis Date  . Complication of anesthesia   . Diabetes mellitus without complication ([place])   . Hypertension   . PONV (postoperative nausea and vomiting)    Past Surgical History:  Procedure Laterality Date  . ABDOMINAL HYSTERECTOMY    . BREAST EXCISIONAL BIOPSY Right    negative  . CATARACT EXTRACTION, BILATERAL    . EYE SURGERY    . GASTRIC BYPASS  2001   Social History   Socioeconomic History  . Marital status: Widowed    Spouse name: Not on file  . Number of children: Not on file  . Years of education: Not on file  . Highest education level: Not on file  Occupational History  . Not on file  Social Needs  . Financial resource strain: Not on file  . Food insecurity:    Worry: Not on file    Inability: Not on file  . Transportation needs:    Medical: Not on file    Non-medical: Not on file  Tobacco Use  . Smoking status: Never Smoker  . Smokeless tobacco: Never Used  Substance and Sexual Activity  . Alcohol use: No  . Drug use: No  . Sexual activity: Not on file  Lifestyle  . Physical activity:    Days per week: Not on file    Minutes per session: Not on file  . Stress: Not on file  Relationships  . Social connections:    Talks on phone: Not on file    Gets together: Not on file    Attends religious service: Not on file    Active member of club or organization: Not on file    Attends meetings of clubs or organizations: Not on file    Relationship status: Not on file  Other Topics Concern  . Not on file  Social History Narrative  . Not on file    History reviewed. No pertinent family history. Allergies  Allergen Reactions  . Hydrocodone-Acetaminophen Nausea And Vomiting  . Nsaids Other (See Comments)    Has had gastric surgery   Prior to Admission medications   Medication Sig Start Date End Date Taking? Authorizing Provider  albuterol (PROVENTIL HFA;VENTOLIN HFA) 108 (90 Base) MCG/ACT inhaler Inhale 1-2 puffs into the lungs every 4 (four) hours as needed for wheezing or shortness of breath.    Yes [provider]  cilostazol (PLETAL) 100 MG tablet Take 100 mg by mouth 2 (two) times daily.  08/19/16  Yes [provider]  diclofenac sodium (VOLTAREN) 1 % GEL Apply 1 application topically 3 (three) times daily as needed (pain).   Yes [provider]  dorzolamide-timolol (COSOPT) 22.3-6.8 MG/ML ophthalmic solution Place 1 drop into both eyes 2 (two) times daily. 05/26/18  Yes [provider]  furosemide (LASIX) 80 MG tablet Take 80 mg by mouth daily as needed for fluid.   Yes [provider]  gabapentin (NEURONTIN) 100 MG capsule Take 100 mg by mouth at bedtime.    Yes [provider]  hydrochlorothiazide (HYDRODIURIL) 12.5 MG tablet Take 1 tablet by mouth daily. 05/13/18  Yes [provider]  HYDROcodone-acetaminophen (NORCO/VICODIN) 5-325 MG tablet Take 1-2 tablets by mouth every 6 (six) hours as needed. Patient taking differently: Take 1-2 tablets by mouth every 6 (six) hours as needed (pain).  06/05/18  Yes Jessy Oto, MD  latanoprost (XALATAN) 0.005 % ophthalmic solution Place 1 drop into both eyes every morning.   Yes [provider]  linaclotide (LINZESS) 290 MCG CAPS capsule Take 290 mcg by mouth daily before breakfast.   Yes [provider]  losartan (COZAAR) 100 MG tablet Take 100 mg by mouth daily. 05/05/18  Yes [provider]  metformin (FORTAMET) 1000 MG (OSM) 24 hr tablet Take 1,000 mg by mouth every morning. 05/21/18  Yes [provider]  metoprolol succinate (TOPROL-XL) 25 MG 24 hr tablet Take 25 mg by mouth daily. 05/15/18  Yes [provider]  montelukast (SINGULAIR) 10 MG tablet Take 10 mg by mouth every morning.   Yes [provider]  Multiple Vitamins-Minerals (MULTIVITAMIN WITH MINERALS) tablet Take 1 tablet by mouth daily.   Yes [provider]  ondansetron (ZOFRAN-ODT) 4 MG disintegrating tablet Take 1 tablet by mouth every 4 (four) hours as needed for nausea or vomiting.  11/28/15  Yes [provider]  oxybutynin (DITROPAN) 5 MG tablet Take 5 mg by mouth daily. 03/28/18  Yes [provider]  OZEMPIC, 0.25 OR 0.5 MG/DOSE, 2 MG/1.5ML SOPN Inject 50 Units into the muscle once a week. Wednesday 04/30/18  Yes [provider]  pantoprazole (PROTONIX) 40 MG tablet Take 40 mg by mouth daily.   Yes [provider]  phentermine (ADIPEX-P) 37.5 MG tablet Take 1 tablet by mouth daily before breakfast.  04/19/18  Yes [provider]  potassium chloride SA (K-DUR,KLOR-CON) 20 MEQ tablet Take 20 mEq by mouth 2 (two) times daily. *DISSOLVABLE   Yes [provider]  traMADol (ULTRAM) 50 MG tablet Take 50 mg by mouth every 6 (six) hours as needed.   Yes [provider]  TRESIBA FLEXTOUCH 200 UNIT/ML SOPN Inject 20 Units into the muscle at bedtime. 04/06/18  Yes [provider]  Vitamin D, Ergocalciferol, (DRISDOL) 50000 units CAPS capsule Take 50,000 Units by mouth every 7 (seven) days. Tuesday   Yes [provider]     Positive ROS: All other systems have been reviewed and were otherwise negative with the exception of those mentioned in the HPI and as above.  Physical Exam: General: Alert, no acute distress Cardiovascular: No pedal edema Respiratory: No cyanosis, no use of accessory musculature GI: No organomegaly, abdomen is soft and non-tender Skin: No lesions in the area of chief complaint Neurologic: Sensation  intact distally Psychiatric: Patient is competent for consent with normal mood and affect Lymphatic: No axillary or cervical lymphadenopathy  MUSCULOSKELETAL: ROM right knee 10-90 degrees Valgus deformity, grating Patellofemoral joint. Patella grind test is positive. Tender medial and lateral right knee joint line, 2 + effusion. DP1+ PT 1+, Motor and sensory is normal  Radiographs 05/25/2018 right knee demonstrate narrowing of the lateral and medial joint line, and patellofemoral joint line. Calcification of the medial and lateral menisci consistent with severe chondrocalcinosis, bone on bone appearance of the patellofemoral joint.   Assessment: Severe osteoarthritis right knee, patellofemoral greater than lateral greater than medial joint line with chondrocalcinosis.  Peripheral vascular disease, moderately severe great toe 0.49 toe index.   Plan: Plan for Procedure(s): RIGHT TOTAL KNEE ARTHROPLASTY  The risks benefits and alternatives were discussed with the patient including but not limited  to the risks of nonoperative treatment, versus surgical intervention including infection, bleeding, nerve injury,  blood clots, cardiopulmonary complications, morbidity, mortality, among others, and they were willing to proceed.   Basil Dess, MD Cell [phone] Office [phone] 07/13/2018 7:28 AM

## 2018-07-13 NOTE — Evaluation (Signed)
Physical Therapy Evaluation Patient Details Name: Alexandra Tran MRN: [id number] DOB: 1945-12-10 Today's Date: 07/13/2018   History of Present Illness  Pt is a 72 y/o female s/p elective R TKA. PMH includes DM, HTN, asthma, OSA on CPAP, and L carpal tunnel release.   Clinical Impression  Pt is s/p surgery above with deficits below. Pt sleepy during session and only able to tolerate a few feet of ambulation before having to return to bed. Required min A for steadying with use of RW. Educated about knee precautions and supine HEP. Pt reports she plans to go to SNF at d/c. Will continue to follow acutely to maximize functional mobility independence and safety.     Follow Up Recommendations Follow surgeon's recommendation for DC plan and follow-up therapies;Supervision for mobility/OOB(SNF per pt)    Equipment Recommendations  None recommended by PT    Recommendations for Other Services       Precautions / Restrictions Precautions Precautions: Knee Precaution Booklet Issued: Yes (comment) Precaution Comments: Reviewed knee precautions and supine HEP.  Required Braces or Orthoses: Knee Immobilizer - Right Knee Immobilizer - Right: Other (comment)(until discontinued ) Restrictions Weight Bearing Restrictions: Yes RLE Weight Bearing: Weight bearing as tolerated      Mobility  Bed Mobility Overal bed mobility: Needs Assistance Bed Mobility: Sit to Supine;Supine to Sit     Supine to sit: Min assist Sit to supine: Min assist   General bed mobility comments: Min A for RLE assist. Increased time required to perform bed mobility tasks. Pt returned to supine at end of session, as she reports being very sleepy.   Transfers Overall transfer level: Needs assistance Equipment used: Rolling walker (2 wheeled) Transfers: Sit to/from Stand Sit to Stand: Min assist;From elevated surface         General transfer comment: Min A from elevated surface. Verbal cues for safe hand placement  and sequencing when using R KI.   Ambulation/Gait Ambulation/Gait assistance: Min assist Gait Distance (Feet): 2 Feet Assistive device: Rolling walker (2 wheeled) Gait Pattern/deviations: Step-to pattern;Decreased step length - right;Decreased step length - left;Decreased weight shift to right;Antalgic Gait velocity: Decreased    General Gait Details: Slow, antalgic gait. Min A for steadying. Only able to tolerate short distance as pt reporting she was very sleepy and wanted to return to bed. Verbal cues for sequencin gusing RW.   Stairs            Wheelchair Mobility    Modified Rankin (Stroke Patients Only)       Balance Overall balance assessment: Needs assistance Sitting-balance support: No upper extremity supported;Feet supported Sitting balance-Leahy Scale: Good     Standing balance support: Bilateral upper extremity supported;During functional activity Standing balance-Leahy Scale: Poor Standing balance comment: Reliant on BUE support.                              Pertinent Vitals/Pain Pain Assessment: 0-10 Pain Score: 8  Pain Location: R knee  Pain Descriptors / Indicators: Aching;Operative site guarding;Grimacing Pain Intervention(s): Limited activity within patient's tolerance;Monitored during session;Repositioned    Home Living Family/patient expects to be discharged to:: Skilled nursing facility Living Arrangements: Alone                    Prior Function Level of Independence: Independent with assistive device(s)         Comments: Used rollator for ambulation      Hand Dominance  Extremity/Trunk Assessment   Upper Extremity Assessment Upper Extremity Assessment: Generalized weakness    Lower Extremity Assessment Lower Extremity Assessment: RLE deficits/detail RLE Deficits / Details: Reports slightly decreased sensation. Reporting "tightness" in knee. Deficits consistent with post op pain and weakness.         Communication   Communication: No difficulties  Cognition Arousal/Alertness: Suspect due to medications Behavior During Therapy: WFL for tasks assessed/performed Overall Cognitive Status: Within Functional Limits for tasks assessed                                 General Comments: Pt sleepy during session which limited mobility.       General Comments General comments (skin integrity, edema, etc.): Pt's daughter present during session.     Exercises Total Joint Exercises Heel Slides: AAROM;Right;15 reps;Supine   Assessment/Plan    PT Assessment Patient needs continued PT services  PT Problem List Decreased strength;Decreased range of motion;Decreased activity tolerance;Decreased balance;Decreased mobility;Decreased knowledge of use of DME;Decreased knowledge of precautions;Pain       PT Treatment Interventions DME instruction;Gait training;Stair training;Functional mobility training;Therapeutic activities;Therapeutic exercise;Balance training;Patient/family education    PT Goals (Current goals can be found in the Care Plan section)  Acute Rehab PT Goals Patient Stated Goal: "to be able to walk into the bathroom"  PT Goal Formulation: With patient Time For Goal Achievement: 07/27/18 Potential to Achieve Goals: Good    Frequency 7X/week   Barriers to discharge Decreased caregiver support      Co-evaluation               AM-PAC PT "6 Clicks" Daily Activity  Outcome Measure Difficulty turning over in bed (including adjusting bedclothes, sheets and blankets)?: A Little Difficulty moving from lying on back to sitting on the side of the bed? : Unable Difficulty sitting down on and standing up from a chair with arms (e.g., wheelchair, bedside commode, etc,.)?: Unable Help needed moving to and from a bed to chair (including a wheelchair)?: A Little Help needed walking in hospital room?: A Little Help needed climbing 3-5 steps with a railing? : A Lot 6  Click Score: 13    End of Session Equipment Utilized During Treatment: Gait belt;Right knee immobilizer Activity Tolerance: Patient limited by lethargy Patient left: in bed;with call bell/phone within reach;with family/visitor present Nurse Communication: Mobility status PT Visit Diagnosis: Other abnormalities of gait and mobility (R26.89);Unsteadiness on feet (R26.81);Pain Pain - Right/Left: Right Pain - part of body: Knee    Time: 8295-6213 PT Time Calculation (min) (ACUTE ONLY): 29 min   Charges:   PT Evaluation $PT Eval Low Complexity: 1 Low PT Treatments $Therapeutic Activity: 8-22 mins        Leighton Ruff, PT, DPT  Acute Rehabilitation Services  Pager: [phone] Office: [phone]   Rudean Hitt 07/13/2018, 4:33 PM

## 2018-07-13 NOTE — Interval H&P Note (Signed)
History and Physical Interval Note:  07/13/2018 7:36 AM  Alexandra Tran  has presented today for surgery, with the diagnosis of severe osteoarthritis right knee  The various methods of treatment have been discussed with the patient and family. After consideration of risks, benefits and other options for treatment, the patient has consented to  Procedure(s) with comments: RIGHT TOTAL KNEE ARTHROPLASTY (Right) - Needs RNFA as a surgical intervention .  The patient's history has been reviewed, patient examined, no change in status, stable for surgery.  I have reviewed the patient's chart and labs.  Questions were answered to the patient's satisfaction.     Basil Dess

## 2018-07-13 NOTE — Anesthesia Postprocedure Evaluation (Signed)
Anesthesia Post Note  Patient: Alexandra Tran  Procedure(s) Performed: RIGHT TOTAL KNEE ARTHROPLASTY (Right Knee)     Patient location during evaluation: PACU Anesthesia Type: Spinal Level of consciousness: oriented and awake and alert Pain management: pain level controlled Vital Signs Assessment: post-procedure vital signs reviewed and stable Respiratory status: spontaneous breathing, respiratory function stable and patient connected to nasal cannula oxygen Cardiovascular status: blood pressure returned to baseline and stable Postop Assessment: no headache, no backache and no apparent nausea or vomiting Anesthetic complications: no    Last Vitals:  Vitals:   07/13/18 1140 07/13/18 1200  BP:  104/60  Pulse: 82 94  Resp: 12 14  Temp:  36.6 C  SpO2: 100% 94%                  Effie Berkshire

## 2018-07-13 NOTE — Anesthesia Procedure Notes (Signed)
Spinal  Start time: 07/13/2018 7:50 AM End time: 07/13/2018 7:52 AM Staffing Anesthesiologist: Effie Berkshire, MD Performed: anesthesiologist  Preanesthetic Checklist Completed: patient identified, site marked, surgical consent, pre-op evaluation, timeout performed, IV checked, risks and benefits discussed and monitors and equipment checked Spinal Block Patient position: sitting Prep: site prepped and draped and DuraPrep Location: L3-4 Injection technique: single-shot Needle Needle type: Pencan  Needle gauge: 24 G Needle length: 10 cm Needle insertion depth: 10 cm Additional Notes Patient tolerated well. No immediate complications.

## 2018-07-13 NOTE — Transfer of Care (Signed)
Immediate Anesthesia Transfer of Care Note  Patient: Clemens Catholic  Procedure(s) Performed: RIGHT TOTAL KNEE ARTHROPLASTY (Right Knee)  Patient Location: PACU  Anesthesia Type:MAC combined with regional for post-op pain  Level of Consciousness: awake, alert  and oriented  Airway & Oxygen Therapy: Patient Spontanous Breathing  Post-op Assessment: Report given to RN and Post -op Vital signs reviewed and stable  Post vital signs: Reviewed and stable  Last Vitals:  Vitals Value Taken Time  BP    Temp    Pulse 81 07/13/2018 11:00 AM  Resp 19 07/13/2018 11:00 AM  SpO2 97 % 07/13/2018 11:00 AM  Vitals shown include unvalidated device data.  Last Pain:  Vitals:   07/13/18 5956  TempSrc:   PainSc: 0-No pain         Complications: No apparent anesthesia complications

## 2018-07-13 NOTE — Op Note (Addendum)
07/13/2018  11:16 AM  PATIENT:  Alexandra Tran  72 y.o. female  MRN: [id number]  OPERATIVE REPORT   PRE-OPERATIVE DIAGNOSIS:  severe osteoarthritis right knee  POST-OPERATIVE DIAGNOSIS:  severe osteoarthritis right knee  PROCEDURE:  Procedure(s): RIGHT TOTAL KNEE ARTHROPLASTY    SURGEON: Basil Dess, MD      ASSISTANT:  Meagan CRNFA  (Present throughout the entire procedure and necessary for completion of procedure in a timely manner)      ANESTHESIA:  Spinal with bupivicaine with supplemental right adductor block. And local anesthetic marcaine 0.5% 1:1 exparel 1.3% total 15 cc.       COMPLICATIONS:  None.  EBL 200CC  TOTAL TOURNIQUET TIME: 95 minutes at 367mHg     COMPONENTS:  DePuy Attune cemented total knee system.  A #5  femoral component.  A #5 tibial tray with a 558mpolyethylene RP tibial spacer , a 35 mm polyethylene patella.  Implant Name Type Inv. Item Serial No. Manufacturer Lot No. LRB No. Used  CEMENT HV SMART SET - [id number] CEMENT HV SMART SET  DEPUY SYNTHES [id number] 1  CEMENT HV SMART SET - [id number] CEMENT HV SMART SET  DEPUY SYNTHES [id number] 1  PATELLA MEDIAL ATTUN 35MM KNEE - [id number] PATELLA MEDIAL ATTUN 35MM KNEE  DEPUY SYNTHES [id number] 1  ATTUNE PS FEM RT SZ 5 CEM KNEE - [id number] ATTUNE PS FEM RT SZ 5 CEM KNEE  DEPUY SYNTHES [id number] 1  TIBIAL BASE ROT PLAT SZ 5 KNEE - [id number] TIBIAL BASE ROT PLAT SZ 5 KNEE  DEPUY ORTHOPAEDICS [id number] 1  ATTUNE PSRP INSR SZ5 5MM KNEE - [id number] ATTUNE PSRP INSR SZ5 5MM KNEE  DEPUY SYNTHES [id number] 1       PROCEDURE:The patient was met in the holding area, and the appropriate right knee identified and marked with "X" and my initials. The patient did not received a preoperative femoral nerve block by anesthesia.  The patient was then transported to OR and was placed on the operative table in a supine position. The patient was then placed under  general anesthesia without difficulty. The patient received appropriate preoperative antibiotic prophylaxis. The patien'ts left knee was examined under anesthesia and shown to have 0-130 range of motion,varus deformity, ligamentously stable, and normal patella tracking.Tourniquet was applied to the operative thigh. Leg was then prepped using sterile conditions and draped using sterile technique. Time-out procedure was called and correct right knee identified.  The right leg was elevated and Esmarch exsanguinated with a thigh  tourniquet elevated ot 300 mmHg.  Initially, through a 15-cm longitudinal incision based over the patella initial exposure was made. The underlying subcutaneous tissues were incised along with the skin incision. A median arthrotomy was performed revealing an excessive amount of normal-appearing joint fluid, The articular surfaces were inspected. The patient had grade 3 changes medially, grade 4 changes laterally, and grade 4 changes in the patellofemoral joint. Osteophytes were removed from the femoral condyles and the tibial plateau. The medial and lateral meniscal remnants were removed as well as the anterior cruciate ligament.   Attention then turned to the femur where the knee was flexed to 90 degrees and an intramedullary guide placed a drill hole placed just anterior 7 mm to the intercondylar notch on the distal femur. The distal cutting jig then attached to the intramedulllay nail 5 degrees of valgus for the left knee. A planned 9 mm to be removed off the most prominent  condyle, medial in this case. The jig pinned in place and the distal femoral cut performed.  Anterior bow of the femur measured at 0. Based on this then the amount of proximal tibial cut was determined to be in line with off the depressed medial side of the joint 22m. Using the proximal tibial cutting jig with the leg alignment guide the jig was pinned to the proximal tibia. 3 pins were used 0 degrees of posterior  slope.Tibia then subluxed and proximal tibial cut was then performed. Soft tissue tensioning then examined and found to be well balanced in extension. #4 femoral component chosen. The trial placed against the end of the femur and felt to be a good fit. Distal femur cutting jig was then carefully positioned on the distal femur using the end guide and placing 2 pins in the anterior end of the cut distal femur.  2 threaded pins used to pin the jig in place   Rotation guide was then corrected the alignment 2 pins placed over the distal cut surface of the femur for placement of the jig for the chamfer cuts and coronal cuts. This is a carefully placed and held in place with threaded pins. Protecting soft tissues then the anterior cut was made after first verifying with the wing depth of the cut. Then the posterior coronal cuts protect soft tissue structures posteriorly. Posterior chamfer cut and anterior chamfer cut. These were done without difficulty. Cutting guide removed and box cutting jig was then applied to the distal femur carefully aligned and pinned into place. Box cut was then performed from the distal femur intercondylar box. Proximal tibia was then subluxed the posterior cruciate resected. A #5 plate and attached to the transverse cut surface of the proximal tibia using 2 pins. The upright for the reamer was then applied and reaming carried out for the keel for the tibial component. Using the osteotome was then inserted and impacted into place the temporary fixation pins removed from the proximal tibial plate. Femoral component was inserted using a trial component. Then a 5 mm insert placed and the knee brought into full extension full extension to 0 and 1.5 hyperextension was possible full flexion to 135 without difficulty or lift off. The components were accepted and the permanent #4 femoral and tibial components chosen and brought onto the field. Patella was observed to be a width anterior to posterior  19 mm and the residual 14 mm was allowed for the patella component. The cutting jig was then adjusted to allow for 14 mm remaining width. Applied and then the coronal cut the posterior aspect of the patella was performed. Lateral facet of patella showed very little resection. The trial 35 mm patella was chosen based on the size of the cut surface of patella. The 35 mm drill plate applied and drill holes placed through the posterior aspect of the patella. Trial reduction with the 35 mm trial and the leg placed through range of motion showed excellent motion full extension and flexion 135 patella tendency to elevate medially was clamped in place over the medial retinaculum patellar showed normal appearance with flexion extension. No shuck noted then the knee was stable to varus and valgus at 0 30 and 60. Expose were then removed and the knee was then irrigated with copious amounts of. Solution. A 35 mm permanent patella prosthesis also brought to the field. Cement was mixed the knee was subluxed and carefully soft tissues protected note that the lug holes in the distal femur  where drill using the trial prosthesis and with the drill guide provided. With irrigation completed and permanent tibial components were then inserted into place using cement and a semi-putty state over the proximal aspect of the tibia cement was also applied to the deep surface of the tibial component as well as over the posterior runners of the femur and the deep surface of the patella component. These were then carefully place excess cement removed about the circumference of the tibial tray the femur was then placed applying cement to the cut it distal aspect of the femur and posterior chamfer area anterior chamfer and anterior coronal cut surface small amount within the box. Femoral component was then impacted into place excess cement removed amounts of circumference including the posterior chamfer area and the posterior femoral condyle  area. The trial tibial peg then placed in the tibial component and a 12.5 mm rotating platform trial was inserted knee brought into full extension observed on the appeared to be in 2 of valgus with full extension 1 of hyperextension. Cement was then applied to the posterior cut surface of the patella within each of the patella peg opening was then pin holes were placed for the cementing along the lateral aspect of the patella. Patella component was then carefully aligned and inserted over the posterior aspect of the patella and clamped in place excess cement was then resected circumferentially using scalpel in order for her to preserve cement the lateral facet area. Cement was allowed to fully harden tourniquet was released while cement was nearly completely hardened. Following this then irrigation was carried down careful inspection demonstrated some small bleeders present over the lateral posterior aspect of the knee joint cauterized using electrocautery off to the medial aspect and the areas of geniculate arteries. There is no active bleeding present then at that time. Trial 5 mm tibial insert demonstrated stability of the anterior drawer and negative and the knee stable to varus valgus stress and 30 and 60 flexion and full extension. A 5 mm rotating platform insert was then chosen this was applied to the tibia using the permanent implant removing the trial examining tibia and determined there was no evident residual cement remaining. Also examined posterior runners I posterior aspect of the femoral condyles for any residual cement none was present. The tibial insert in place the reduced placed through range of motion and full extension flexion and 30 knee stable to varus stress at 0 30 and 60 anterior drawer negative. This component was accepted.Tourniquet released at 95 minutes.  A drain was not necessary the synovium reapproximated over the medial aspect of the knee with 0 Vicryl sutures.  The  retinaculum of the knee and the quadriceps tendon were then reapproximated with interrupted #1 Vicryl sutures. Peritenon of the reapproximated with interrupted 0 Vicryl sutures deep subcutaneous layers approximated with interrupted 0 and 2-0 Vicryl sutures and the skin closed with a stainless steel staples. Dermabond was then applied to a small area along the incision and medial anterior knee abrasion. 15 cc of marcaine 1/2%1:1 exparel 1.3%instilled into the right knee. Adaptic 4 x 4's ABDs pads fixed to the skin with sterile webril an Ace wrap applied from the foot to the right upper thigh 3 ice pack and knee immobilizer. All instrument and sponge counts were correct. Then reactivated extubated and returned to recovery room in satisfactory condition.  Meagan CRNFA perform the duties of assistant surgeon she performed careful retraction of soft tissues assisted in addition the patient had removal  on the OR table is present beginning the case the case and performed closure of the incision from the peritenon to the skin and application of dressing.   Basil Dess 07/13/2018, 11:16 AM

## 2018-07-13 NOTE — Anesthesia Procedure Notes (Signed)
Anesthesia Regional Block: Adductor canal block   Pre-Anesthetic Checklist: ,, timeout performed, Correct Patient, Correct Site, Correct Laterality, Correct Procedure, Correct Position, site marked, Risks and benefits discussed,  Surgical consent,  Pre-op evaluation,  At surgeon's request and post-op pain management  Laterality: Right  Prep: chloraprep       Needles:  Injection technique: Single-shot  Needle Type: Echogenic Stimulator Needle     Needle Length: 9cm  Needle Gauge: 21     Additional Needles:   Procedures:,,,, ultrasound used (permanent image in chart),,,,  Narrative:  Start time: 07/13/2018 7:20 AM End time: 07/13/2018 7:25 AM Injection made incrementally with aspirations every 5 mL.  Performed by: Personally  Anesthesiologist: Effie Berkshire, MD  Additional Notes: Patient tolerated the procedure well. Local anesthetic introduced in an incremental fashion under minimal resistance after negative aspirations. No paresthesias were elicited. After completion of the procedure, no acute issues were identified and patient continued to be monitored by RN.

## 2018-07-13 NOTE — Progress Notes (Signed)
Patient stated she does not wear CPAP at home and does not want one here.

## 2018-07-13 NOTE — Brief Op Note (Signed)
07/13/2018  10:52 AM  PATIENT:  Clemens Catholic  72 y.o. female  PRE-OPERATIVE DIAGNOSIS:  severe osteoarthritis right knee  POST-OPERATIVE DIAGNOSIS:  severe osteoarthritis right knee  PROCEDURE:  Procedure(s) with comments: RIGHT TOTAL KNEE ARTHROPLASTY (Right) - Needs RNFA  SURGEON:  Surgeon(s) and Role:    * Jessy Oto, MD - Primary  CRNFA ASSISTANT: Meagan.  ANESTHESIA:   local, regional and spinal, Dr. Smith Robert.  EBL:  200 mL   BLOOD ADMINISTERED:none  DRAINS: Urinary Catheter (Foley)   LOCAL MEDICATIONS USED:  MARCAINE 0.5% 1:1 EXPAREL 1.3% Amount: 15 ml  SPECIMEN:  No Specimen  DISPOSITION OF SPECIMEN:  N/A  COUNTS:  YES  TOURNIQUET:   Total Tourniquet Time Documented: Thigh (Right) - 95 minutes Total: Thigh (Right) - 95 minutes   DICTATION: .Viviann Spare Dictation  PLAN OF CARE: Admit to inpatient   PATIENT DISPOSITION:  PACU - hemodynamically stable.   Delay start of Pharmacological VTE agent (>24hrs) due to surgical blood loss or risk of bleeding: no

## 2018-07-13 NOTE — Progress Notes (Signed)
Orthopedic Tech Progress Note Patient Details:  Alexandra Tran [birth year] [id number]  CPM Right Knee CPM Right Knee: On Right Knee Flexion (Degrees): 60 Right Knee Extension (Degrees): 0 Additional Comments: foot roll  Post Interventions Patient Tolerated: Well Instructions Provided: Care of device  [name] 07/13/2018, 2:44 PM

## 2018-07-14 ENCOUNTER — Encounter (HOSPITAL_COMMUNITY): Payer: Self-pay | Admitting: Specialist

## 2018-07-14 DIAGNOSIS — R000 Tachycardia, unspecified: Secondary | ICD-10-CM | POA: Diagnosis present

## 2018-07-14 DIAGNOSIS — E1165 Type 2 diabetes mellitus with hyperglycemia: Secondary | ICD-10-CM | POA: Diagnosis present

## 2018-07-14 DIAGNOSIS — Z96651 Presence of right artificial knee joint: Secondary | ICD-10-CM

## 2018-07-14 DIAGNOSIS — D62 Acute posthemorrhagic anemia: Secondary | ICD-10-CM

## 2018-07-14 DIAGNOSIS — I10 Essential (primary) hypertension: Secondary | ICD-10-CM | POA: Diagnosis present

## 2018-07-14 LAB — BASIC METABOLIC PANEL WITH GFR
Anion gap: 11 (ref 5–15)
BUN: 12 mg/dL (ref 8–23)
CO2: 24 mmol/L (ref 22–32)
Calcium: 8.7 mg/dL — ABNORMAL LOW (ref 8.9–10.3)
Chloride: 99 mmol/L (ref 98–111)
Creatinine, Ser: 0.68 mg/dL (ref 0.44–1.00)
GFR calc Af Amer: >60 mL/min
GFR calc non Af Amer: >60 mL/min
Glucose, Bld: 227 mg/dL — ABNORMAL HIGH (ref 70–99)
Potassium: 3.8 mmol/L (ref 3.5–5.1)
Sodium: 134 mmol/L — ABNORMAL LOW (ref 135–145)

## 2018-07-14 LAB — CBC
HCT: 33.2 % — ABNORMAL LOW (ref 36.0–46.0)
Hemoglobin: 10.8 g/dL — ABNORMAL LOW (ref 12.0–15.0)
MCH: 30.3 pg (ref 26.0–34.0)
MCHC: 32.5 g/dL (ref 30.0–36.0)
MCV: 93 fL (ref 80.0–100.0)
Platelets: 200 10*3/uL (ref 150–400)
RBC: 3.57 MIL/uL — ABNORMAL LOW (ref 3.87–5.11)
RDW: 12.7 % (ref 11.5–15.5)
WBC: 8.4 10*3/uL (ref 4.0–10.5)
nRBC: 0 % (ref 0.0–0.2)

## 2018-07-14 LAB — GLUCOSE, CAPILLARY
GLUCOSE-CAPILLARY: 215 mg/dL — AB (ref 70–99)
GLUCOSE-CAPILLARY: 214 mg/dL — AB (ref 70–99)
Glucose-Capillary: 252 mg/dL — ABNORMAL HIGH (ref 70–99)
Glucose-Capillary: 221 mg/dL — ABNORMAL HIGH (ref 70–99)

## 2018-07-14 MED ORDER — SODIUM CHLORIDE 0.9 % IV BOLUS
500.0000 mL | Freq: Once | INTRAVENOUS | Status: AC
  Administered 2018-07-14: 500 mL via INTRAVENOUS

## 2018-07-14 MED ORDER — LACTATED RINGERS IV BOLUS
1000.0000 mL | Freq: Once | INTRAVENOUS | Status: AC
  Administered 2018-07-14: 1000 mL via INTRAVENOUS

## 2018-07-14 MED ORDER — DIGOXIN 0.25 MG/ML IJ SOLN
0.5000 mg | Freq: Once | INTRAMUSCULAR | Status: AC
  Administered 2018-07-14: 0.5 mg via INTRAVENOUS
  Filled 2018-07-14: qty 2

## 2018-07-14 NOTE — NC FL2 (Signed)
[place] LEVEL OF CARE SCREENING TOOL     IDENTIFICATION  Patient Name: Alexandra Tran Birthdate: Dec 02, 1945 Sex: female Admission Date (Current Location): 07/13/2018  [place] and [place] Number:  Mining engineer)   Facility and Address:  [facility], [place] [address] [place], [place] [address]      Provider Number: [id number]  Attending Physician Name and Address:  Jessy Oto, MD  Relative Name and Phone Number:  Otila Kluver (daughter) [phone]    Current Level of Care: Hospital Recommended Level of Care: [place] Prior Approval Number:    Date Approved/Denied:   PASRR Number: [id number]  Discharge Plan: SNF    Current Diagnoses: Patient Active Problem List   Diagnosis Date Noted  . Unilateral primary osteoarthritis, right knee 07/13/2018    Class: Chronic  . Peripheral vascular disease of extremity ([place]) 07/13/2018    Class: Chronic  . S/P TKR (total knee replacement) using cement, right 07/13/2018    Orientation RESPIRATION BLADDER Height & Weight     Self, Time, Situation, Place  Normal Continent Weight:   Height:     BEHAVIORAL SYMPTOMS/MOOD NEUROLOGICAL BOWEL NUTRITION STATUS      Continent Diet(see discharge summary)  AMBULATORY STATUS COMMUNICATION OF NEEDS Skin   Limited Assist Verbally Surgical wounds(right leg closed incision)                       Personal Care Assistance Level of Assistance  Bathing, Feeding, Dressing, Total care Bathing Assistance: Limited assistance Feeding assistance: Independent Dressing Assistance: Limited assistance Total Care Assistance: Limited assistance   Functional Limitations Info  Sight, Hearing, Speech Sight Info: Adequate Hearing Info: Adequate Speech Info: Adequate    SPECIAL CARE FACTORS FREQUENCY  PT (By licensed PT), OT (By licensed OT)     PT Frequency: 5x weekly OT Frequency: 5x weekly            Contractures Contractures Info: Not  present    Additional Factors Info  Allergies   Allergies Info: Allergies:  Hydrocodone-acetaminophen, Nsaids           Current Medications (07/14/2018):  This is the current hospital active medication list Current Facility-Administered Medications  Medication Dose Route Frequency Provider Last Rate Last Dose  . 0.9 %  sodium chloride infusion   Intravenous Continuous Jessy Oto, MD 75 mL/hr at 07/14/18 1146    . acetaminophen (TYLENOL) tablet 325-650 mg  325-650 mg Oral Q6H PRN Jessy Oto, MD      . albuterol (PROVENTIL) (2.5 MG/3ML) 0.083% nebulizer solution 2.5 mg  2.5 mg Nebulization Q4H PRN Jessy Oto, MD      . aluminum hydroxide (AMPHOJEL/ALTERNAGEL) suspension 15-30 mL  15-30 mL Oral Q4H PRN Jessy Oto, MD      . aspirin EC tablet 81 mg  81 mg Oral BID PC Jessy Oto, MD   81 mg at 07/14/18 1030  . bisacodyl (DULCOLAX) EC tablet 5 mg  5 mg Oral Daily PRN Jessy Oto, MD      . cilostazol (PLETAL) tablet 100 mg  100 mg Oral BID Jessy Oto, MD   100 mg at 07/14/18 1031  . diphenhydrAMINE (BENADRYL) 12.5 MG/5ML elixir 12.5-25 mg  12.5-25 mg Oral Q4H PRN Jessy Oto, MD      . docusate sodium (COLACE) capsule 100 mg  100 mg Oral BID Jessy Oto, MD   100 mg at 07/14/18 1030  .  dorzolamide-timolol (COSOPT) 22.3-6.8 MG/ML ophthalmic solution 1 drop  1 drop Both Eyes BID Jessy Oto, MD   1 drop at 07/14/18 1032  . ferrous sulfate tablet 325 mg  325 mg Oral BID WC Jessy Oto, MD   325 mg at 07/14/18 1029  . furosemide (LASIX) tablet 80 mg  80 mg Oral Daily PRN Jessy Oto, MD      . gabapentin (NEURONTIN) capsule 100 mg  100 mg Oral QHS Jessy Oto, MD   100 mg at 07/13/18 2107  . hydrochlorothiazide (HYDRODIURIL) tablet 12.5 mg  12.5 mg Oral Daily Jessy Oto, MD   12.5 mg at 07/14/18 1030  . HYDROcodone-acetaminophen (NORCO) 7.5-325 MG per tablet 1-2 tablet  1-2 tablet Oral Q4H PRN Jessy Oto, MD      . HYDROcodone-acetaminophen  (NORCO/VICODIN) 5-325 MG per tablet 1-2 tablet  1-2 tablet Oral Q4H PRN Jessy Oto, MD   2 tablet at 07/14/18 0427  . insulin aspart (novoLOG) injection 0-15 Units  0-15 Units Subcutaneous TID WC Jessy Oto, MD   8 Units at 07/14/18 1144  . insulin glargine (LANTUS) injection 20 Units  20 Units Subcutaneous QHS Jessy Oto, MD   20 Units at 07/13/18 2115  . latanoprost (XALATAN) 0.005 % ophthalmic solution 1 drop  1 drop Both Eyes QHS Jessy Oto, MD   1 drop at 07/13/18 2108  . linaclotide (LINZESS) capsule 290 mcg  290 mcg Oral QAC breakfast Jessy Oto, MD   290 mcg at 07/14/18 1029  . losartan (COZAAR) tablet 100 mg  100 mg Oral Daily Jessy Oto, MD   100 mg at 07/14/18 1032  . menthol-cetylpyridinium (CEPACOL) lozenge 3 mg  1 lozenge Oral PRN Jessy Oto, MD       Or  . phenol (CHLORASEPTIC) mouth spray 1 spray  1 spray Mouth/Throat PRN Jessy Oto, MD      . methocarbamol (ROBAXIN) tablet 500 mg  500 mg Oral Q6H PRN Jessy Oto, MD       Or  . methocarbamol (ROBAXIN) 500 mg in dextrose 5 % 50 mL IVPB  500 mg Intravenous Q6H PRN Jessy Oto, MD      . metoCLOPramide (REGLAN) tablet 5-10 mg  5-10 mg Oral Q8H PRN Jessy Oto, MD       Or  . metoCLOPramide (REGLAN) injection 5-10 mg  5-10 mg Intravenous Q8H PRN Jessy Oto, MD   10 mg at 07/13/18 1433  . metoprolol succinate (TOPROL-XL) 24 hr tablet 25 mg  25 mg Oral Daily Jessy Oto, MD   25 mg at 07/14/18 1031  . montelukast (SINGULAIR) tablet 10 mg  10 mg Oral Grayland Ormond, MD   10 mg at 07/14/18 0640  . morphine 2 MG/ML injection 0.5-1 mg  0.5-1 mg Intravenous Q2H PRN Jessy Oto, MD      . multivitamin with minerals tablet 1 tablet  1 tablet Oral Daily Jessy Oto, MD   1 tablet at 07/14/18 1030  . ondansetron (ZOFRAN-ODT) disintegrating tablet 4 mg  4 mg Oral Q4H PRN Jessy Oto, MD      . oxybutynin (DITROPAN) tablet 5 mg  5 mg Oral Daily Jessy Oto, MD   5 mg at 07/14/18  1031  . pantoprazole (PROTONIX) EC tablet 40 mg  40 mg Oral Daily Jessy Oto, MD   40 mg at 07/14/18 1030  .  polyethylene glycol (MIRALAX / GLYCOLAX) packet 17 g  17 g Oral Daily PRN Jessy Oto, MD      . potassium chloride SA (K-DUR,KLOR-CON) CR tablet 20 mEq  20 mEq Oral BID Jessy Oto, MD   20 mEq at 07/14/18 1030  . sodium phosphate (FLEET) 7-19 GM/118ML enema 1 enema  1 enema Rectal Once PRN Jessy Oto, MD      . traMADol Veatrice Bourbon) tablet 50 mg  50 mg Oral Q6H Jessy Oto, MD   50 mg at 07/14/18 1144  . Vitamin D (Ergocalciferol) (DRISDOL) capsule 50,000 Units  50,000 Units Oral Q7 days Jessy Oto, MD   50,000 Units at 07/14/18 1032     Discharge Medications: Please see discharge summary for a list of discharge medications.  Relevant Imaging Results:  Relevant Lab Results:   Additional Information SSN: [id number]  Alberteen Sam, LCSW

## 2018-07-14 NOTE — Progress Notes (Signed)
MD Louanne Skye at bedside to see patient. Made aware at this time that patient HR is 120-125 at rest. No new orders at this time, will give lopressor per order and resume IV fluids per previous order. Will continue to monitor.

## 2018-07-14 NOTE — Consult Note (Signed)
Medical Consultation   Alexandra Tran  YKZ:[id number]  DOB: 1946/06/09  DOA: 07/13/2018  PCP: Caprice Renshaw, MD   Outpatient Specialists: Louanne Skye - orthopedics   Requesting physician: Louanne Skye - orthopedics  Reason for consultation: DM, HTN, PVD.  She has tachycardia.  Hgb is 10.8, creatinine seems to be ok.  O2 is ok.   History of Present Illness: Alexandra Tran is an 72 y.o. female PVD; HTN; and DM presenting for R TKR.  She had her surgery on 10/14.  She feels good other than the stiffness in her leg.  She is weak and has been since before this happened to her.  No CP or SOB.  She thinks she is not getting the [place] and [place] here in the hospital, but otherwise no medication changes.   Review of Systems:  ROS As per HPI otherwise 10 point review of systems negative.    Past Medical History: Past Medical History:  Diagnosis Date  . Complication of anesthesia   . Diabetes mellitus without complication ([place])   . DJD (degenerative joint disease)   . Hypertension   . PONV (postoperative nausea and vomiting)   . PVD (peripheral vascular disease) ([place])     Past Surgical History: Past Surgical History:  Procedure Laterality Date  . ABDOMINAL HYSTERECTOMY    . BREAST EXCISIONAL BIOPSY Right    negative  . CATARACT EXTRACTION, BILATERAL    . EYE SURGERY    . GASTRIC BYPASS  2001  . TOTAL KNEE ARTHROPLASTY Right 07/13/2018  . TOTAL KNEE ARTHROPLASTY Right 07/13/2018   Procedure: RIGHT TOTAL KNEE ARTHROPLASTY;  Surgeon: Jessy Oto, MD;  Location: [facility];  Service: Orthopedics;  Laterality: Right;  Needs RNFA     Allergies:   Allergies  Allergen Reactions  . Hydrocodone-Acetaminophen Nausea And Vomiting  . Nsaids Other (See Comments)    Has had gastric surgery     Social History:  reports that she has never smoked. She has never used smokeless tobacco. She reports that she does not drink alcohol or use drugs.   Family  History: History reviewed. No pertinent family history.    Physical Exam: Vitals:   07/13/18 2321 07/14/18 0209 07/14/18 1000 07/14/18 1409  BP: (!) 149/73 135/69 (!) 156/71 (!) 148/81  Pulse: (!) 114 (!) 121 (!) 119 (!) 116  Resp: 18 18 18 16   Temp: 99.8 F (37.7 C) (!) 97.5 F (36.4 C) 98.5 F (36.9 C) 98.2 F (36.8 C)  TempSrc: Oral Oral Oral Oral  SpO2: 99% 98% 98% 100%    Constitutional: Alert and awake, oriented x3, not in any acute distress. Eyes:  EOMI, irises appear normal, anicteric sclera,  ENMT: external ears and nose appear normal, normal hearing, Lips appear normal Neck: neck appears normal, no masses, normal ROM, no thyromegaly, no JVD  CVS: S1-S2 clear, clear tachycardia to about 140 but she is asymptomatic and this appears to be a regular rhythm; no murmur rubs or gallops, no LE edema, normal pedal pulses  Respiratory:  clear to auscultation bilaterally, no wheezing, rales or rhonchi. Respiratory effort normal. No accessory muscle use.  Abdomen: soft nontender, nondistended, normal bowel sounds, no hepatosplenomegaly, no hernias  Musculoskeletal: : no cyanosis, clubbing or edema noted bilaterally; R knee is in a wrap and Dr. Louanne Skye had just evaluated it and was rewrapping it when I entered the room Neuro: Cranial nerves II-XII intact, strength,  sensation, reflexes Psych: judgement and insight appear normal, stable mood and affect, mental status Skin: no rashes or lesions or ulcers, no induration or nodules    Data reviewed:  I have personally reviewed the recent labs and imaging studies  Pertinent Labs:   Glucose 227, 215, 252 Hgb 10.8 BMP, CBC otherwise unremarkable   Inpatient Medications:   Scheduled Meds: . aspirin EC  81 mg Oral BID PC  . cilostazol  100 mg Oral BID  . docusate sodium  100 mg Oral BID  . dorzolamide-timolol  1 drop Both Eyes BID  . ferrous sulfate  325 mg Oral BID WC  . gabapentin  100 mg Oral QHS  . hydrochlorothiazide  12.5 mg  Oral Daily  . insulin aspart  0-15 Units Subcutaneous TID WC  . insulin glargine  20 Units Subcutaneous QHS  . latanoprost  1 drop Both Eyes QHS  . linaclotide  290 mcg Oral QAC breakfast  . losartan  100 mg Oral Daily  . metoprolol succinate  25 mg Oral Daily  . montelukast  10 mg Oral BH-q7a  . multivitamin with minerals  1 tablet Oral Daily  . oxybutynin  5 mg Oral Daily  . pantoprazole  40 mg Oral Daily  . potassium chloride SA  20 mEq Oral BID  . traMADol  50 mg Oral Q6H  . Vitamin D (Ergocalciferol)  50,000 Units Oral Q7 days   Continuous Infusions: . sodium chloride 75 mL/hr at 07/14/18 1146  . methocarbamol (ROBAXIN) IV       Radiological Exams on Admission: No results found.  Impression/Recommendations Principal Problem:   S/P TKR (total knee replacement) using cement, right Active Problems:   Acute blood loss anemia   Tachycardia   Diabetes mellitus with hyperglycemia (HCC)   Essential hypertension  S/p TKR -Patient appears to be doing well post-operatively -She feels stiff but does not complain of pain -Her knee seems unlikely the source of her tachycardia, although this is still possible  Tachycardia -The patient has had persistent tachycardia post-operatively, as high as the 160s -EKG with sinus tach with rate 123 -Suspect that this is related to volume depletion in the setting of post-operative care -Will bolus x 1000 cc and continue to follow -I have consider medication withdrawal including beta blocker; ETOH withdrawal (denies ETOH use); infection (no evidence); pain (controlled); and PE (normal O2 sats and no SOB - will not order CTA at this time) -If not improving with IVF, will need to consider CTA for further imaging and likely will need additional of telemetry  Anemia -Slightly lower than prior but not alarmingly so - seems unlike the cause of her tachycardia  DM -Metformin has been held and she is appropriately being covered with  SSI  HTN -Continue home Cozaar and Metoprolol -BMP in AM    Thank you for this consultation.  Our [facility] hospitalist team will follow the patient with you.   Time Spent: 34 minutes  Karmen Bongo M.D. [facility] 07/14/2018, 4:42 PM

## 2018-07-14 NOTE — Progress Notes (Signed)
Inpatient Diabetes Program Recommendations  AACE/ADA: New Consensus Statement on Inpatient Glycemic Control (2015)  Target Ranges:  Prepandial:   less than 140 mg/dL      Peak postprandial:   less than 180 mg/dL (1-2 hours)      Critically ill patients:  140 - 180 mg/dL   Lab Results  Component Value Date   GLUCAP 252 (H) 07/14/2018   HGBA1C 7.8 (H) 07/13/2018    Review of Glycemic Control Results for Alexandra Tran, Alexandra Tran (MRN [id number]) as of 07/14/2018 11:55  Ref. Range 07/13/2018 11:01 07/13/2018 16:35 07/13/2018 21:23 07/14/2018 06:15 07/14/2018 11:43  Glucose-Capillary Latest Ref Range: 70 - 99 mg/dL 80 169 (H) 188 (H) 215 (H) 252 (H)    Diabetes history: DM Outpatient Diabetes medications:  Ozempic weekly, Metformin 1000 mg bid, Tresiba 20 units daily  Current orders for Inpatient glycemic control:  Lantus 20 units q HS, Novolog moderate tid with meals Inpatient Diabetes Program Recommendations:   May consider increasing Lantus to 25 units q HS. Also consider adding Novolog meal coverage 3 units tid with meals.    Thanks,  Adah Perl, RN, BC-ADM Inpatient Diabetes Coordinator Pager [phone] (8a-5p)

## 2018-07-14 NOTE — Plan of Care (Signed)

## 2018-07-14 NOTE — Progress Notes (Signed)
Patient ID: Alexandra Tran, female   DOB: 1946/04/22, 72 y.o.   MRN: [id number] Heart rate is 120s,  EKG is sinus tachycardia. Hgb 10.8 BMET is normal  Low calcium Will ask for Medicine consult.

## 2018-07-14 NOTE — Progress Notes (Signed)
Physical Therapy Treatment Patient Details Name: Alexandra Tran MRN: [id number] DOB: 1946/05/30 Today's Date: 07/14/2018    History of Present Illness Pt is a 72 y/o female s/p elective R TKA. PMH includes DM, HTN, asthma, OSA on CPAP, and L carpal tunnel release.     PT Comments    Pt needs much encouragement to mobilize. She expresses that she feels that she is not getting as much help as she needs, education given on the value of teaching her how to move safely and letting her try to do as much as she can on her own to return to independence quickly. Min A for sit to stand and 20' ambulation with RW. Pt reports full body fatigue from recent health issues. Pt tolerated there ex with assist. PT will continue to follow.    Follow Up Recommendations  Follow surgeon's recommendation for DC plan and follow-up therapies;Supervision for mobility/OOB(SNF per pt)     Equipment Recommendations  None recommended by PT    Recommendations for Other Services       Precautions / Restrictions Precautions Precautions: Knee Precaution Comments: Reviewed knee precautions and supine HEP.  Required Braces or Orthoses: Knee Immobilizer - Right Knee Immobilizer - Right: Other (comment)(until discontinued ) Restrictions Weight Bearing Restrictions: Yes RLE Weight Bearing: Weight bearing as tolerated    Mobility  Bed Mobility               General bed mobility comments: sitting EOB getting cleaned up upon PT arrival  Transfers Overall transfer level: Needs assistance Equipment used: Rolling walker (2 wheeled) Transfers: Sit to/from Stand Sit to Stand: Min assist         General transfer comment: min A from bed and recliner, vc's for hand placement and encouragement  Ambulation/Gait Ambulation/Gait assistance: Min assist Gait Distance (Feet): 20 Feet Assistive device: Rolling walker (2 wheeled) Gait Pattern/deviations: Step-to pattern;Decreased step length - right;Decreased  step length - left;Decreased weight shift to right;Antalgic Gait velocity: Decreased  Gait velocity interpretation: <1.31 ft/sec, indicative of household ambulator General Gait Details: vc's for sequencing and for staying within RW, tends to push it too far ahead   Stairs             Wheelchair Mobility    Modified Rankin (Stroke Patients Only)       Balance Overall balance assessment: Needs assistance Sitting-balance support: No upper extremity supported;Feet supported Sitting balance-Leahy Scale: Good     Standing balance support: Bilateral upper extremity supported;During functional activity Standing balance-Leahy Scale: Poor Standing balance comment: Reliant on BUE support.                             Cognition Arousal/Alertness: Lethargic Behavior During Therapy: Flat affect Overall Cognitive Status: Within Functional Limits for tasks assessed                                 General Comments: pt kept eyes closed most of session but no asleep. Appears cognitively in tact but very difficult to motivate. She mentioned that she could not get people to come move her leg for her. Education given that we would be teaching her how to care for herself so she could return to independence ASAP      Exercises Total Joint Exercises Ankle Circles/Pumps: AROM;Both;10 reps;Seated Quad Sets: AROM;Both;10 reps;Seated Gluteal Sets: AROM;Both;10 reps;Seated Hip ABduction/ADduction: AAROM;Right;10 reps;Seated Long  Arc [employer];Right;10 reps;Seated Goniometric ROM: 5-85    General Comments General comments (skin integrity, edema, etc.): daughter present for session      Pertinent Vitals/Pain Pain Assessment: 0-10 Pain Score: 8  Pain Location: R knee  Pain Descriptors / Indicators: Aching;Operative site guarding;Grimacing Pain Intervention(s): Limited activity within patient's tolerance;Monitored during session;RN gave pain meds during session     Home Living                      Prior Function            PT Goals (current goals can now be found in the care plan section) Acute Rehab PT Goals Patient Stated Goal: "to be able to walk into the bathroom"  PT Goal Formulation: With patient Time For Goal Achievement: 07/27/18 Potential to Achieve Goals: Good Progress towards PT goals: Progressing toward goals    Frequency    7X/week      PT Plan Current plan remains appropriate    Co-evaluation              AM-PAC PT "6 Clicks" Daily Activity  Outcome Measure  Difficulty turning over in bed (including adjusting bedclothes, sheets and blankets)?: A Little Difficulty moving from lying on back to sitting on the side of the bed? : Unable Difficulty sitting down on and standing up from a chair with arms (e.g., wheelchair, bedside commode, etc,.)?: Unable Help needed moving to and from a bed to chair (including a wheelchair)?: A Little Help needed walking in hospital room?: A Little Help needed climbing 3-5 steps with a railing? : A Lot 6 Click Score: 13    End of Session Equipment Utilized During Treatment: Gait belt(pt without R knee buckling, did not use KI ) Activity Tolerance: Patient limited by fatigue Patient left: with call bell/phone within reach;with family/visitor present;in chair Nurse Communication: Mobility status PT Visit Diagnosis: Other abnormalities of gait and mobility (R26.89);Unsteadiness on feet (R26.81);Pain Pain - Right/Left: Right Pain - part of body: Knee     Time: 1111-1141 PT Time Calculation (min) (ACUTE ONLY): 30 min  Charges:  $Gait Training: 8-22 mins $Therapeutic Exercise: 8-22 mins                     [place]  Pager [phone] Office [place] 07/14/2018, 11:59 AM

## 2018-07-14 NOTE — Progress Notes (Signed)
     Subjective: 1 Day Post-Op Procedure(s) (LRB): RIGHT TOTAL KNEE ARTHROPLASTY (Right) Awake, alert and oriented x 4. Heart rate is increased in to the 120s will check EKG for afib/affluter.  Patient reports pain as moderate.    Objective:   VITALS:  Temp:  [97.5 F (36.4 C)-99.8 F (37.7 C)] 98.2 F (36.8 C) (10/15 1409) Pulse Rate:  [104-121] 116 (10/15 1409) Resp:  [16-18] 16 (10/15 1409) BP: (132-156)/(69-81) 148/81 (10/15 1409) SpO2:  [97 %-100 %] 100 % (10/15 1409) Heart rate is increased on Sat monitor, check pul  Neurologically intact ABD soft Neurovascular intact Sensation intact distally Intact pulses distally Dorsiflexion/Plantar flexion intact Incision: dressing C/D/I No cellulitis present   LABS Recent Labs    07/14/18 0322  HGB 10.8*  WBC 8.4  PLT 200   Recent Labs    07/14/18 0322  NA 134*  K 3.8  CL 99  CO2 24  BUN 12  CREATININE 0.68  GLUCOSE 227*   No results for input(s): LABPT, INR in the last 72 hours.   Assessment/Plan: 1 Day Post-Op Procedure(s) (LRB): RIGHT TOTAL KNEE ARTHROPLASTY (Right) Anemia due to blood loss.  Tachycardia on round this afternoon.  Advance diet Up with therapy  Check EKG assess for afib/aflutter.   Basil Dess 07/14/2018, 3:30 PMPatient ID: Alexandra Tran, female   DOB: Jan 31, 1946, 72 y.o.   MRN: [id number]

## 2018-07-14 NOTE — Clinical Social Work Note (Signed)
Clinical Social Work Assessment  Patient Details  Name: Alexandra Tran MRN: [id number] Date of Birth: 01-14-1946  Date of referral:  07/14/18               Reason for consult:  Discharge Planning                Permission sought to share information with:  Case Manager, Facility Sport and exercise psychologist, Family Supports Permission granted to share information::  Yes, Verbal Permission Granted  Name::     Holiday representative::  SNFs  Relationship::  daughter  Contact Information:  [phone]  Housing/Transportation Living arrangements for the past 2 months:  [place] of Information:  Patient Patient Interpreter Needed:  None Criminal Activity/Legal Involvement Pertinent to Current Situation/Hospitalization:  No - Comment as needed Significant Relationships:  Adult Children Lives with:  Self Do you feel safe going back to the place where you live?  No Need for family participation in patient care:  Yes (Comment)  Care giving concerns:  CSW received referral for possible SNF placement at time of discharge. Spoke with patient regarding possibility of SNF placement . Patient's  daughter  is currently unable to care for her at their home given patient's current needs and fall risk.  Patient and daughter expressed understanding of PT recommendation and are agreeable to SNF placement at time of discharge. CSW to continue to follow and assist with discharge planning needs.     Social Worker assessment / plan:  Spoke with patient and daughter concerning possibility of rehab at [facility] before returning home.    Employment status:  Retired Nurse, adult PT Recommendations:  [place] / Referral to community resources:  [place]  Patient/Family's Response to care:  Patient and  Daughter recognize need for rehab before returning home and are agreeable to a SNF in [place]. They report preference for  [facility], [facility], or open to other options in [place]. CSW explained insurance authorization process. Patient's family reported that they want patient to get stronger to be able to come back home.    Patient/Family's Understanding of and Emotional Response to Diagnosis, Current Treatment, and Prognosis:  Patient/family is realistic regarding therapy needs and expressed being hopeful for SNF placement. Patient expressed understanding of CSW role and discharge process as well as medical condition. No questions/concerns about plan or treatment.    Emotional Assessment Appearance:  Appears stated age Attitude/Demeanor/Rapport:  Uncooperative, Guarded, Complaining Affect (typically observed):  Frustrated Orientation:  Oriented to Self, Oriented to Place, Oriented to  Time, Oriented to Situation Alcohol / Substance use:  Not Applicable Psych involvement (Current and /or in the community):  No (Comment)  Discharge Needs  Concerns to be addressed:  Discharge Planning Concerns Readmission within the last 30 days:  No Current discharge risk:  Dependent with Mobility Barriers to Discharge:  Continued Medical Work up   [employer], [place] 07/14/2018, 2:36 PM

## 2018-07-15 ENCOUNTER — Inpatient Hospital Stay (HOSPITAL_COMMUNITY): Payer: Medicare Other

## 2018-07-15 DIAGNOSIS — I351 Nonrheumatic aortic (valve) insufficiency: Secondary | ICD-10-CM

## 2018-07-15 DIAGNOSIS — D62 Acute posthemorrhagic anemia: Secondary | ICD-10-CM

## 2018-07-15 DIAGNOSIS — I361 Nonrheumatic tricuspid (valve) insufficiency: Secondary | ICD-10-CM

## 2018-07-15 DIAGNOSIS — R000 Tachycardia, unspecified: Secondary | ICD-10-CM

## 2018-07-15 DIAGNOSIS — I10 Essential (primary) hypertension: Secondary | ICD-10-CM

## 2018-07-15 LAB — GLUCOSE, CAPILLARY
GLUCOSE-CAPILLARY: 135 mg/dL — AB (ref 70–99)
Glucose-Capillary: 191 mg/dL — ABNORMAL HIGH (ref 70–99)
Glucose-Capillary: 187 mg/dL — ABNORMAL HIGH (ref 70–99)

## 2018-07-15 LAB — BASIC METABOLIC PANEL
ANION GAP: 5 (ref 5–15)
BUN: 10 mg/dL (ref 8–23)
CALCIUM: 8.4 mg/dL — AB (ref 8.9–10.3)
CO2: 25 mmol/L (ref 22–32)
CREATININE: 0.66 mg/dL (ref 0.44–1.00)
Chloride: 103 mmol/L (ref 98–111)
GFR calc Af Amer: >60 mL/min (ref >60)
Glucose, Bld: 167 mg/dL — ABNORMAL HIGH (ref 70–99)
Potassium: 4.1 mmol/L (ref 3.5–5.1)
SODIUM: 133 mmol/L — AB (ref 135–145)

## 2018-07-15 LAB — CBC
HEMATOCRIT: 29.1 % — AB (ref 36.0–46.0)
Hemoglobin: 9.7 g/dL — ABNORMAL LOW (ref 12.0–15.0)
MCH: 30.7 pg (ref 26.0–34.0)
MCHC: 33.3 g/dL (ref 30.0–36.0)
MCV: 92.1 fL (ref 80.0–100.0)
NRBC: 0 % (ref 0.0–0.2)
Platelets: 196 10*3/uL (ref 150–400)
RBC: 3.16 MIL/uL — ABNORMAL LOW (ref 3.87–5.11)
RDW: 12.9 % (ref 11.5–15.5)
WBC: 10.7 10*3/uL — ABNORMAL HIGH (ref 4.0–10.5)

## 2018-07-15 LAB — D-DIMER, QUANTITATIVE (NOT AT ARMC): D DIMER QUANT: 4.41 ug{FEU}/mL — AB (ref 0.00–0.50)

## 2018-07-15 LAB — ECHOCARDIOGRAM COMPLETE

## 2018-07-15 MED ORDER — IOPAMIDOL (ISOVUE-370) INJECTION 76%
100.0000 mL | Freq: Once | INTRAVENOUS | Status: AC | PRN
  Administered 2018-07-15: 100 mL via INTRAVENOUS

## 2018-07-15 MED ORDER — FERROUS SULFATE 325 (65 FE) MG PO TABS: 325.0000 mg | ORAL_TABLET | Freq: Two times a day (BID) | ORAL | 1 refills | Status: DC

## 2018-07-15 MED ORDER — DOCUSATE SODIUM 100 MG PO CAPS: 100.0000 mg | ORAL_CAPSULE | Freq: Two times a day (BID) | ORAL | 0 refills | Status: DC

## 2018-07-15 MED ORDER — INSULIN ASPART 100 UNIT/ML ~~LOC~~ SOLN
3.0000 [IU] | Freq: Three times a day (TID) | SUBCUTANEOUS | Status: DC
  Administered 2018-07-15 – 2018-07-17 (×6): 3 [IU] via SUBCUTANEOUS

## 2018-07-15 MED ORDER — ENOXAPARIN SODIUM 40 MG/0.4ML ~~LOC~~ SOLN
40.0000 mg | SUBCUTANEOUS | Status: DC
  Administered 2018-07-15: 40 mg via SUBCUTANEOUS
  Filled 2018-07-15: qty 0.4

## 2018-07-15 MED ORDER — HYDROCODONE-ACETAMINOPHEN 7.5-325 MG PO TABS: 1.0000 | ORAL_TABLET | ORAL | 0 refills | Status: DC | PRN

## 2018-07-15 MED ORDER — METHOCARBAMOL 500 MG PO TABS: 500.0000 mg | ORAL_TABLET | Freq: Four times a day (QID) | ORAL | 1 refills | Status: DC | PRN

## 2018-07-15 MED ORDER — IOPAMIDOL (ISOVUE-370) INJECTION 76%
INTRAVENOUS | Status: AC
  Filled 2018-07-15: qty 100

## 2018-07-15 MED ORDER — ASPIRIN 81 MG PO TBEC: 81.0000 mg | DELAYED_RELEASE_TABLET | Freq: Two times a day (BID) | ORAL | 1 refills | Status: DC

## 2018-07-15 MED ORDER — INSULIN GLARGINE 100 UNIT/ML ~~LOC~~ SOLN
25.0000 [IU] | Freq: Every day | SUBCUTANEOUS | Status: DC
  Administered 2018-07-15 – 2018-07-16 (×2): 25 [IU] via SUBCUTANEOUS
  Filled 2018-07-15 (×3): qty 0.25

## 2018-07-15 NOTE — Progress Notes (Signed)
CSW received notification that patient has a bed at [facility] when medically ready to discharge.   Consulted with Dr. Louanne Skye, concluded patient not ready for discharge today.   CSW will continue to follow until patient medically ready to discharge to SNF at [facility].   [place], [place]

## 2018-07-15 NOTE — Progress Notes (Signed)
PROGRESS NOTE  Alexandra Tran BSJ:[id number] DOB: May 05, 1946 DOA: 07/13/2018 PCP: Caprice Renshaw, MD  HPI/Recap of past 24 hours: Alexandra Tran is an 72 y.o. female with hx of PVD, HTN and DM presenting for R TKR which was done on 10/14. TRH consulted due to tachycardia post op.   Today, pt denies any new complaints, denies any chest pain, SOB, abdominal pain, N/V/D/C, fever/chills.  Assessment/Plan: Principal Problem:   S/P TKR (total knee replacement) using cement, right Active Problems:   Acute blood loss anemia   Tachycardia   Diabetes mellitus with hyperglycemia (HCC)   Essential hypertension  Post-op tachycardia HR 110's EKG sinus tachy Afebrile with now leukocytosis DD: ?dehydration Vs infection Vs uncontrolled pain, rule out PE D-dimer elevated (likely due to recent surgery) CTA chest pending ECHO pending S/P IVF Follow up closely  S/P TKR Management per ortho PT/OT  Normocytic anemia Likely 2/2 post op Vs dilutional No signs of bleeding Daily CBC, monitor closely  DM type 2 Continue SSI, lantus, accu-checks  HTN Stable Continue cozaar, metoprolol      Code Status: Full  Family Communication: Spoke with daughter who is a Therapist, sports over the phone  Disposition Plan: SNF    Consultants:  TRH  Procedures:  S/P TKR   Antimicrobials:  None   DVT prophylaxis: Lovenox   Objective: Vitals:   07/15/18 1046 07/15/18 1053 07/15/18 1213 07/15/18 1341  BP: 129/62 129/62 129/62 135/60  Pulse: (!) 122 (!) 122 (!) 109 (!) 110  Resp: 18  18 18   Temp: 98.6 F (37 C)   97.7 F (36.5 C)  TempSrc: Oral   Oral  SpO2: 100%  98% 98%    Intake/Output Summary (Last 24 hours) at 07/15/2018 1737 Last data filed at 07/15/2018 1450 Gross per 24 hour  Intake 0 ml  Output 1000 ml  Net -1000 ml   There were no vitals filed for this visit.  Exam:   General:  NAD  Cardiovascular: S1, S2 present  Respiratory: CTAB  Abdomen: Soft, NT,  ND, BS present   Musculoskeletal: RLE in ACE wrap, no pedal edema noted bilaterally  Skin: Normal  Psychiatry: Normal mood   Data Reviewed: CBC: Recent Labs  Lab 07/14/18 0322 07/15/18 0300  WBC 8.4 10.7*  HGB 10.8* 9.7*  HCT 33.2* 29.1*  MCV 93.0 92.1  PLT 200 765   Basic Metabolic Panel: Recent Labs  Lab 07/14/18 0322 07/15/18 0300  NA 134* 133*  K 3.8 4.1  CL 99 103  CO2 24 25  GLUCOSE 227* 167*  BUN 12 10  CREATININE 0.68 0.66  CALCIUM 8.7* 8.4*   GFR: Estimated Creatinine Clearance: 74.1 mL/min (by C-G formula based on SCr of 0.66 mg/dL). Liver Function Tests: No results for input(s): AST, ALT, ALKPHOS, BILITOT, PROT, ALBUMIN in the last 168 hours. No results for input(s): LIPASE, AMYLASE in the last 168 hours. No results for input(s): AMMONIA in the last 168 hours. Coagulation Profile: No results for input(s): INR, PROTIME in the last 168 hours. Cardiac Enzymes: No results for input(s): CKTOTAL, CKMB, CKMBINDEX, TROPONINI in the last 168 hours. BNP (last 3 results) No results for input(s): PROBNP in the last 8760 hours. HbA1C: Recent Labs    07/13/18 1224  HGBA1C 7.8*   CBG: Recent Labs  Lab 07/14/18 1624 07/14/18 2209 07/15/18 0625 07/15/18 1210 07/15/18 1644  GLUCAP 221* 214* 135* 191* 187*   Lipid Profile: No results for input(s): CHOL, HDL, LDLCALC, TRIG, CHOLHDL, LDLDIRECT  in the last 72 hours. Thyroid Function Tests: No results for input(s): TSH, T4TOTAL, FREET4, T3FREE, THYROIDAB in the last 72 hours. Anemia Panel: No results for input(s): VITAMINB12, FOLATE, FERRITIN, TIBC, IRON, RETICCTPCT in the last 72 hours. Urine analysis:    Component Value Date/Time   COLORURINE YELLOW 07/03/2018 [place] 07/03/2018 1204   LABSPEC 1.011 07/03/2018 1204   PHURINE 7.0 07/03/2018 1204   GLUCOSEU NEGATIVE 07/03/2018 1204   HGBUR NEGATIVE 07/03/2018 1204   BILIRUBINUR NEGATIVE 07/03/2018 1204   KETONESUR NEGATIVE  07/03/2018 1204   PROTEINUR NEGATIVE 07/03/2018 1204   NITRITE NEGATIVE 07/03/2018 1204   LEUKOCYTESUR NEGATIVE 07/03/2018 1204   Sepsis Labs: @LABRCNTIP (procalcitonin:4,lacticidven:4)  )No results found for this or any previous visit (from the past 240 hour(s)).    Studies: No results found.  Scheduled Meds: . aspirin EC  81 mg Oral BID PC  . cilostazol  100 mg Oral BID  . docusate sodium  100 mg Oral BID  . dorzolamide-timolol  1 drop Both Eyes BID  . ferrous sulfate  325 mg Oral BID WC  . gabapentin  100 mg Oral QHS  . hydrochlorothiazide  12.5 mg Oral Daily  . insulin aspart  0-15 Units Subcutaneous TID WC  . insulin aspart  3 Units Subcutaneous TID WC  . insulin glargine  25 Units Subcutaneous QHS  . latanoprost  1 drop Both Eyes QHS  . linaclotide  290 mcg Oral QAC breakfast  . losartan  100 mg Oral Daily  . metoprolol succinate  25 mg Oral Daily  . montelukast  10 mg Oral BH-q7a  . multivitamin with minerals  1 tablet Oral Daily  . oxybutynin  5 mg Oral Daily  . pantoprazole  40 mg Oral Daily  . potassium chloride SA  20 mEq Oral BID  . traMADol  50 mg Oral Q6H  . Vitamin D (Ergocalciferol)  50,000 Units Oral Q7 days    Continuous Infusions: . methocarbamol (ROBAXIN) IV       LOS: 2 days     Alma Friendly, MD [facility]   If 7PM-7AM, please contact night-coverage [email or web address] 07/15/2018, 5:37 PM

## 2018-07-15 NOTE — Progress Notes (Signed)
Dr Alexander Mt was sent a page in regards to the patient's daughter requesting to speak about plan of care.  Francesca Jewett [phone])

## 2018-07-15 NOTE — Discharge Summary (Addendum)
Physician Discharge Summary  Patient ID: Alexandra Tran MRN: [id number] DOB/AGE: Mar 15, 1946 72 y.o.  Admit date: 07/13/2018 Discharge date: 07/15/2018  Admission Diagnoses:  S/P TKR (total knee replacement) using cement, right  Discharge Diagnoses:  Principal Problem:   S/P TKR (total knee replacement) using cement, right Active Problems:   Acute blood loss anemia   Tachycardia   Diabetes mellitus with hyperglycemia (HCC)   Essential hypertension   Past Medical History:  Diagnosis Date  . Complication of anesthesia   . Diabetes mellitus without complication ([place])   . DJD (degenerative joint disease)   . Hypertension   . PONV (postoperative nausea and vomiting)   . PVD (peripheral vascular disease) ([place])     Surgeries: Procedure(s): RIGHT TOTAL KNEE ARTHROPLASTY on 07/13/2018   Consultants (if any): [facility] Dr. Lorin Mercy.   Discharged Condition: Improved  Hospital Course: Alexandra Tran is an 72 y.o. female who was admitted 07/13/2018 with a diagnosis of S/P TKR (total knee replacement) using cement, right and went to the operating room on 07/13/2018 and underwent the above named procedures.    She was given perioperative antibiotics:  Anti-infectives (From admission, onward)   Start     Dose/Rate Route Frequency Ordered Stop   07/13/18 0615  ceFAZolin (ANCEF) IVPB 2g/100 mL premix     2 g 200 mL/hr over 30 Minutes Intravenous On call to O.R. 07/13/18 3545 07/13/18 0755    .  She was given sequential compression devices, early ambulation, and aspirin 81 mg BID for DVT prophylaxis. POD#1 awake and alert and oriented. She demonstrated elevated heart rate, EKG was sinus tachycardia, heart rate as high as 160 bpm, blood loss intraop was 200cc with tourniquet deflated prior to closure, no drain necessary. She was restarted on her metoprolol and this provided some improvement but her rate remained in the 120. An  Internal medicine consult was requested. An  echocardiogram performed on POD#2. Hgb on POD31 was 10.8 and recheck was 9.7 she was given fluid boluses and IVF to challenge and assess for hypovoluemia. The pulse rate remained elevated at 116. Her family is expecting that following her hospital stay she will transfer to a SNF near her home in [place], and a SNF in [place], [place] SNF is available however her current tachycardia requires further evaluation prior to her discharge.    She has progressed in PT with standing and ambulation with a walker full weight bearing as tolerated. CPM started post op and is at 0-70 degrees progressing 10 degrees per day. She is taking and tolerating po nourishment and narcotic medication.       She benefited maximally from the hospital stay and there were no complications.     Recent vital signs:  Vitals:   07/15/18 1213 07/15/18 1341  BP: 129/62 135/60  Pulse: (!) 109 (!) 110  Resp: 18 18  Temp:  97.7 F (36.5 C)  SpO2: 98% 98%    Recent laboratory studies:  Lab Results  Component Value Date   HGB 9.7 (L) 07/15/2018   HGB 10.8 (L) 07/14/2018   HGB 12.1 07/03/2018   Lab Results  Component Value Date   WBC 10.7 (H) 07/15/2018   PLT 196 07/15/2018   Lab Results  Component Value Date   INR 1.04 07/03/2018   Lab Results  Component Value Date   NA 133 (L) 07/15/2018   K 4.1 07/15/2018   CL 103 07/15/2018   CO2 25 07/15/2018   BUN 10 07/15/2018  CREATININE 0.66 07/15/2018   GLUCOSE 167 (H) 07/15/2018    Discharge Medications:   Allergies as of 07/15/2018      Reactions   Hydrocodone-acetaminophen Nausea And Vomiting   Nsaids Other (See Comments)   Has had gastric surgery      Medication List    STOP taking these medications   HYDROcodone-acetaminophen 5-325 MG tablet Commonly known as:  NORCO/VICODIN Replaced by:  HYDROcodone-acetaminophen 7.5-325 MG tablet   traMADol 50 MG tablet Commonly known as:  ULTRAM     TAKE these medications   albuterol 108  (90 Base) MCG/ACT inhaler Commonly known as:  PROVENTIL HFA;VENTOLIN HFA Inhale 1-2 puffs into the lungs every 4 (four) hours as needed for wheezing or shortness of breath.   aspirin 81 MG EC tablet Take 1 tablet (81 mg total) by mouth 2 (two) times daily after a meal.   cilostazol 100 MG tablet Commonly known as:  PLETAL Take 100 mg by mouth 2 (two) times daily.   diclofenac sodium 1 % Gel Commonly known as:  VOLTAREN Apply 1 application topically 3 (three) times daily as needed (pain).   docusate sodium 100 MG capsule Commonly known as:  COLACE Take 1 capsule (100 mg total) by mouth 2 (two) times daily.   dorzolamide-timolol 22.3-6.8 MG/ML ophthalmic solution Commonly known as:  COSOPT Place 1 drop into both eyes 2 (two) times daily.   ferrous sulfate 325 (65 FE) MG tablet Take 1 tablet (325 mg total) by mouth 2 (two) times daily with a meal.   furosemide 80 MG tablet Commonly known as:  LASIX Take 80 mg by mouth daily as needed for fluid.   gabapentin 100 MG capsule Commonly known as:  NEURONTIN Take 100 mg by mouth at bedtime.   hydrochlorothiazide 12.5 MG tablet Commonly known as:  HYDRODIURIL Take 1 tablet by mouth daily.   HYDROcodone-acetaminophen 7.5-325 MG tablet Commonly known as:  NORCO Take 1-2 tablets by mouth every 4 (four) hours as needed for severe pain (pain score 7-10). Replaces:  HYDROcodone-acetaminophen 5-325 MG tablet   latanoprost 0.005 % ophthalmic solution Commonly known as:  XALATAN Place 1 drop into both eyes every morning.   LINZESS 290 MCG Caps capsule Generic drug:  linaclotide Take 290 mcg by mouth daily before breakfast.   losartan 100 MG tablet Commonly known as:  COZAAR Take 100 mg by mouth daily.   metformin 1000 MG (OSM) 24 hr tablet Commonly known as:  FORTAMET Take 1,000 mg by mouth every morning.   methocarbamol 500 MG tablet Commonly known as:  ROBAXIN Take 1 tablet (500 mg total) by mouth every 6 (six) hours as  needed for muscle spasms.   metoprolol succinate 25 MG 24 hr tablet Commonly known as:  TOPROL-XL Take 25 mg by mouth daily.   montelukast 10 MG tablet Commonly known as:  SINGULAIR Take 10 mg by mouth every morning.   multivitamin with minerals tablet Take 1 tablet by mouth daily.   ondansetron 4 MG disintegrating tablet Commonly known as:  ZOFRAN-ODT Take 1 tablet by mouth every 4 (four) hours as needed for nausea or vomiting.   oxybutynin 5 MG tablet Commonly known as:  DITROPAN Take 5 mg by mouth daily.   OZEMPIC (0.25 OR 0.5 MG/DOSE) 2 MG/1.5ML Sopn Generic drug:  Semaglutide(0.25 or 0.5MG /DOS) Inject 50 Units into the muscle once a week. Wednesday   pantoprazole 40 MG tablet Commonly known as:  PROTONIX Take 40 mg by mouth daily.   phentermine 37.5  MG tablet Commonly known as:  ADIPEX-P Take 1 tablet by mouth daily before breakfast.   potassium chloride SA 20 MEQ tablet Commonly known as:  K-DUR,KLOR-CON Take 20 mEq by mouth 2 (two) times daily. *DISSOLVABLE   TRESIBA FLEXTOUCH 200 UNIT/ML Sopn Generic drug:  Insulin Degludec Inject 20 Units into the muscle at bedtime.   Vitamin D (Ergocalciferol) 50000 units Caps capsule Commonly known as:  DRISDOL Take 50,000 Units by mouth every 7 (seven) days. Tuesday            Durable Medical Equipment  (From admission, onward)         Start     Ordered   07/13/18 1142  DME Walker rolling  Once    Question:  Patient needs a walker to treat with the following condition  Answer:  S/P TKR (total knee replacement) using cement, right   07/13/18 1142   07/13/18 1142  DME 3 n 1  Once     07/13/18 1142          Diagnostic Studies: Dg Chest 2 View  Result Date: 07/03/2018 CLINICAL DATA:  Preop testing knee surgery EXAM: CHEST - 2 VIEW COMPARISON:  None. FINDINGS: The heart size and mediastinal contours are within normal limits. Both lungs are clear. The visualized skeletal structures are unremarkable.  IMPRESSION: No active cardiopulmonary disease. Electronically Signed   By: Alexandra Tran M.D.   On: 07/03/2018 14:59    Disposition:     Follow-up Information    Alexandra Oto, MD.   Specialty:  Orthopedic Surgery Why:  For wound re-check Contact information: [place] [address] [phone]            Signed: Basil Tran 07/15/2018, 3:26 PM Addendum to discharge summary.  Patient had a negative angios CT of her chest no evidence of PE or pneumonia.  No pericardial fluid.  Blood work including folate, ferritin, iron and TIBC revealed a very low iron at 15 and she received some IV iron by the medical service.  Hemoglobin was 8.1 and increased to 8.7 today and she is now stable for transfer.  Patient denied chest pain she was using the CPM and making progress with physical therapy.  She will follow-up with Dr. Louanne Tran as scheduled.

## 2018-07-15 NOTE — Plan of Care (Signed)

## 2018-07-15 NOTE — Progress Notes (Signed)
Echocardiogram in progress

## 2018-07-15 NOTE — Progress Notes (Signed)
     Subjective: 2 Days Post-Op Procedure(s) (LRB): RIGHT TOTAL KNEE ARTHROPLASTY (Right) Awake, alert and oriented x 4. They think my sugars are too high and my heart rate is too fast. Moderate pain.   Patient reports pain as moderate.    Objective:   VITALS:  Temp:  [98.2 F (36.8 C)-99.1 F (37.3 C)] 98.8 F (37.1 C) (10/16 0445) Pulse Rate:  [116-130] 130 (10/16 0445) Resp:  [16-20] 20 (10/16 0445) BP: (141-156)/(60-81) 141/60 (10/16 0445) SpO2:  [96 %-100 %] 96 % (10/16 0445)  Neurologically intact ABD soft Neurovascular intact Sensation intact distally Intact pulses distally Dorsiflexion/Plantar flexion intact Incision: no drainage Compartment soft Right leg and foot is warm and dry. Minimal swelling.    LABS Recent Labs    07/14/18 0322 07/15/18 0300  HGB 10.8* 9.7*  WBC 8.4 10.7*  PLT 200 196   Recent Labs    07/14/18 0322 07/15/18 0300  NA 134* 133*  K 3.8 4.1  CL 99 103  CO2 24 25  BUN 12 10  CREATININE 0.68 0.66  GLUCOSE 227* 167*   No results for input(s): LABPT, INR in the last 72 hours.   Assessment/Plan: 2 Days Post-Op Procedure(s) (LRB): RIGHT TOTAL KNEE ARTHROPLASTY (Right)  Tachycardia, fluid depletion vs medication related.   Advance diet Up with therapy Discharge to SNF when she is medically stable, probably Not today. I will be out of town from this afternoon till Monday, please  Call my office if there are any questions and the doctor on  Call will respond. [phone] "7"  Alexandra Tran and my partners will follow Alexandra Tran until her discharge to SNF. I will complete a discharge summary up to todays Progress.   Basil Dess 07/15/2018, 8:45 AMPatient ID: Alexandra Tran, female   DOB: [birth year], 72 y.o.   MRN: [id number]

## 2018-07-15 NOTE — Progress Notes (Signed)
Physical Therapy Treatment Patient Details Name: Alexandra Tran MRN: [id number] DOB: 1946/02/07 Today's Date: 07/15/2018    History of Present Illness Pt is a 72 y/o female s/p elective R TKA. PMH includes DM, HTN, asthma, OSA on CPAP, and L carpal tunnel release.     PT Comments    Patient needing assist this pm with TKA exercises.  Somewhat lethargic in the bed and reports she was up in the room twice today.  Limited by IV team arriving to place IV for contrast for CT scan.  Will see tomorrow for continued therex and gait training.   Follow Up Recommendations  Follow surgeon's recommendation for DC plan and follow-up therapies;Supervision for mobility/OOB(SNF )     Equipment Recommendations  None recommended by PT    Recommendations for Other Services       Precautions / Restrictions Precautions Precautions: Knee Restrictions Weight Bearing Restrictions: Yes RLE Weight Bearing: Weight bearing as tolerated    Mobility  Bed Mobility               General bed mobility comments: deferred due to IV team arrived to start IV for CT scan  Transfers                    Ambulation/Gait                 Stairs             Wheelchair Mobility    Modified Rankin (Stroke Patients Only)       Balance                                            Cognition Arousal/Alertness: Lethargic Behavior During Therapy: WFL for tasks assessed/performed Overall Cognitive Status: Within Functional Limits for tasks assessed                                 General Comments: keeps eyes closed for therex in bed, encouragement to complete therex, then to attempt ambulation      Exercises Total Joint Exercises Ankle Circles/Pumps: AROM;10 reps;Both;Supine Quad Sets: AROM;10 reps;Both;Supine Towel Squeeze: AROM;5 reps;Both;Supine Short Arc Quad: AAROM;10 reps;Right;Supine Heel Slides: AAROM;10 reps;Right;Supine Hip  ABduction/ADduction: AROM;10 reps;Right;Supine Straight Leg Raises: AAROM;10 reps;Right;Supine Goniometric ROM: 5-40 AAROM in supine    General Comments        Pertinent Vitals/Pain Pain Assessment: Faces Faces Pain Scale: Hurts even more Pain Location: R knee  Pain Descriptors / Indicators: Grimacing;Operative site guarding Pain Intervention(s): Repositioned;Monitored during session    Home Living                      Prior Function            PT Goals (current goals can now be found in the care plan section) Progress towards PT goals: Not progressing toward goals - comment(up in room twice today per pt but not in session)    Frequency    7X/week      PT Plan Current plan remains appropriate    Co-evaluation              AM-PAC PT "6 Clicks" Daily Activity  Outcome Measure  Difficulty turning over in bed (including adjusting bedclothes, sheets and blankets)?: Unable Difficulty moving from lying on  back to sitting on the side of the bed? : Unable Difficulty sitting down on and standing up from a chair with arms (e.g., wheelchair, bedside commode, etc,.)?: Unable Help needed moving to and from a bed to chair (including a wheelchair)?: A Little Help needed walking in hospital room?: A Little Help needed climbing 3-5 steps with a railing? : A Lot 6 Click Score: 11    End of Session   Activity Tolerance: Patient limited by lethargy;Other (comment)(limited by testing due to elevated HR) Patient left: in bed;with call bell/phone within reach;with family/visitor present   PT Visit Diagnosis: Other abnormalities of gait and mobility (R26.89);Unsteadiness on feet (R26.81);Pain Pain - Right/Left: Right Pain - part of body: Knee     Time: 2706-2376 PT Time Calculation (min) (ACUTE ONLY): 21 min  Charges:  $Therapeutic Exercise: 8-22 mins                     Alexandra Tran, [facility] [phone] 07/15/2018    Alexandra Tran 07/15/2018, 3:41 PM

## 2018-07-15 NOTE — Progress Notes (Signed)
The patient's daughter Francesca Jewett (who is a NP) called the nurse in regards to the treatment last night for increased heart rate.  Presently the patient's heart rate  Is 109, while bathing. BP 129/62.  She is wanting to talk with someone regarding cardiac status and treatment plan.

## 2018-07-15 NOTE — Progress Notes (Signed)
Patient remains in CT 

## 2018-07-15 NOTE — Progress Notes (Signed)
  Echocardiogram 2D Echocardiogram has been performed.  Madelaine Etienne 07/15/2018, 2:42 PM

## 2018-07-16 LAB — CBC WITH DIFFERENTIAL/PLATELET
Abs Immature Granulocytes: 0.06 10*3/uL (ref 0.00–0.07)
Basophils Absolute: 0 10*3/uL (ref 0.0–0.1)
Basophils Relative: 0 %
EOS ABS: 0.1 10*3/uL (ref 0.0–0.5)
Eosinophils Relative: 1 %
HEMATOCRIT: 25 % — AB (ref 36.0–46.0)
HEMOGLOBIN: 8.1 g/dL — AB (ref 12.0–15.0)
Immature Granulocytes: 1 %
LYMPHS ABS: 0.8 10*3/uL (ref 0.7–4.0)
Lymphocytes Relative: 8 %
MCH: 29.8 pg (ref 26.0–34.0)
MCHC: 32.4 g/dL (ref 30.0–36.0)
MCV: 91.9 fL (ref 80.0–100.0)
MONO ABS: 0.8 10*3/uL (ref 0.1–1.0)
Monocytes Relative: 8 %
Neutro Abs: 7.4 10*3/uL (ref 1.7–7.7)
Neutrophils Relative %: 82 %
Platelets: 186 10*3/uL (ref 150–400)
RBC: 2.72 MIL/uL — ABNORMAL LOW (ref 3.87–5.11)
RDW: 13.2 % (ref 11.5–15.5)
WBC: 9.1 10*3/uL (ref 4.0–10.5)
nRBC: 0 % (ref 0.0–0.2)

## 2018-07-16 LAB — ABO/RH: ABO/RH(D): O POS

## 2018-07-16 LAB — BASIC METABOLIC PANEL
Anion gap: 5 (ref 5–15)
BUN: 10 mg/dL (ref 8–23)
CALCIUM: 7.9 mg/dL — AB (ref 8.9–10.3)
CO2: 24 mmol/L (ref 22–32)
CREATININE: 0.64 mg/dL (ref 0.44–1.00)
Chloride: 101 mmol/L (ref 98–111)
GFR calc Af Amer: >60 mL/min (ref >60)
GFR calc non Af Amer: >60 mL/min (ref >60)
Glucose, Bld: 170 mg/dL — ABNORMAL HIGH (ref 70–99)
Potassium: 3.9 mmol/L (ref 3.5–5.1)
Sodium: 130 mmol/L — ABNORMAL LOW (ref 135–145)

## 2018-07-16 LAB — GLUCOSE, CAPILLARY
GLUCOSE-CAPILLARY: 200 mg/dL — AB (ref 70–99)
GLUCOSE-CAPILLARY: 154 mg/dL — AB (ref 70–99)
Glucose-Capillary: 149 mg/dL — ABNORMAL HIGH (ref 70–99)
Glucose-Capillary: 141 mg/dL — ABNORMAL HIGH (ref 70–99)

## 2018-07-16 LAB — TYPE AND SCREEN
ABO/RH(D): O POS
Antibody Screen: NEGATIVE

## 2018-07-16 MED ORDER — SENNOSIDES-DOCUSATE SODIUM 8.6-50 MG PO TABS
1.0000 | ORAL_TABLET | Freq: Two times a day (BID) | ORAL | Status: DC
  Administered 2018-07-16 – 2018-07-17 (×2): 1 via ORAL
  Filled 2018-07-16 (×2): qty 1

## 2018-07-16 MED ORDER — METOPROLOL SUCCINATE ER 50 MG PO TB24
50.0000 mg | ORAL_TABLET | Freq: Every day | ORAL | Status: DC
  Administered 2018-07-16 – 2018-07-17 (×2): 50 mg via ORAL
  Filled 2018-07-16 (×2): qty 1

## 2018-07-16 MED ORDER — SENNOSIDES-DOCUSATE SODIUM 8.6-50 MG PO TABS: 2.0000 | ORAL_TABLET | Freq: Two times a day (BID) | ORAL | Status: DC

## 2018-07-16 MED ORDER — POLYETHYLENE GLYCOL 3350 17 G PO PACK
17.0000 g | PACK | Freq: Two times a day (BID) | ORAL | Status: DC
  Administered 2018-07-16 – 2018-07-17 (×2): 17 g via ORAL
  Filled 2018-07-16 (×2): qty 1

## 2018-07-16 MED ORDER — POLYETHYLENE GLYCOL 3350 17 G PO PACK: 17.0000 g | PACK | Freq: Two times a day (BID) | ORAL | Status: DC

## 2018-07-16 NOTE — Clinical Social Work Placement (Deleted)
   CLINICAL SOCIAL WORK PLACEMENT  NOTE  Date:  07/16/2018  Patient Details  Name: Alexandra Tran MRN: [id number] Date of Birth: 1945/11/06  Clinical Social Work is seeking post-discharge placement for this patient at the [place] level of care (*CSW will initial, date and re-position this form in  chart as items are completed):  Yes   Patient/family provided with [place] Work Department's list of facilities offering this level of care within the geographic area requested by the patient (or if unable, by the patient's family).  Yes   Patient/family informed of their freedom to choose among providers that offer the needed level of care, that participate in Medicare, Medicaid or managed care program needed by the patient, have an available bed and are willing to accept the patient.      Patient/family informed of [place]'s ownership interest in [facility] and [facility], as well as of the fact that they are under no obligation to receive care at these facilities.  PASRR submitted to EDS on       PASRR number received on 07/14/18     Existing PASRR number confirmed on       FL2 transmitted to all facilities in geographic area requested by pt/family on 07/14/18     FL2 transmitted to all facilities within larger geographic area on       Patient informed that his/her managed care company has contracts with or will negotiate with certain facilities, including the following:        Yes   Patient/family informed of bed offers received.  Patient [facility] bed at [facility]     Physician recommends and patient chooses bed at      Patient to be transferred to [facility] on 07/16/18.  Patient to be transferred to facility by [facility]     Patient family notified on 07/16/18 of transfer.  Name of family member notified:  Otila Kluver (daughter) at [phone]     PHYSICIAN       Additional Comment:     _______________________________________________ Alberteen Sam, LCSW 07/16/2018, 10:13 AM

## 2018-07-16 NOTE — Progress Notes (Signed)
Patient was given a fleets enema for co having constipation.  Patient had good results.

## 2018-07-16 NOTE — Care Management Note (Signed)
Case Management Note  Patient Details  Name: Alexandra Tran MRN: [id number] Date of Birth: 1945/11/23  Subjective/Objective:  S/p TKR, acute blood loss anemia, DM, Tachycardia                  Action/Plan: NCM spoke to dtr, Linna Hoff via phone and grand-dtr at bedside. Provided Medicare IM. Pt scheduled for dc to SNF today but dc cancelled due to decrease in hemoglobin and increased heart rate. Spoke to Dr Horris Latino and will contact dtr today to follow up on lab work and test. CSW continues to follow for SNF placement.   Expected Discharge Date:  07/16/18               Expected Discharge Plan:  Skilled Nursing Facility  In-House Referral:  Clinical Social Work  Discharge planning Services  CM Consult  Post Acute Care Choice:  NA Choice offered to:  NA  DME Arranged:  N/A DME Agency:  NA  HH Arranged:  NA HH Agency:  NA  Status of Service:  Completed, signed off  If discussed at [employer] of Stay Meetings, dates discussed:    Additional Comments:  Erenest Rasher, RN 07/16/2018, 2:58 PM

## 2018-07-16 NOTE — Progress Notes (Signed)
Receiving numerous calls from the patient's daughter Linna Hoff)  Complaints of not agreeing with management of care ie: continued tachycardia ( heart rate presently 112),  asking questions concerning patient's most recent blood work.  Most of the time, the family member is speaking over the conversation instead of listening to the reason or explanation. Patient is being checked hourly and has her call light with in reach.  All personal items are within her reach.  The patient's states that 'she will not go to the SNF that was discussed earlier in the shift".  The patient request to go to [facility] and the Education officer, museum is aware of this request and has been working with the patient and family, attempting to meet their request.  The patient can speak inappropriately toward the hospital staff at times- suggesting that the staff does not know what they are doing. Attempts to reassure the patient are made continuously.

## 2018-07-16 NOTE — Progress Notes (Signed)
Patient has the tendency to get upset very easily and becomes verbally aggressive toward hospital staff at times.  During these times, the heart rate has noted to increase up to 120- 140's.  Presently the patient is sitting in her recliner with her eyes closed and napping somewhat.  The heart rate on the continuous pulse/oxygenation reading 110.  Patient does not appear to be in any distress.  Oxygen saturation 100%

## 2018-07-16 NOTE — Progress Notes (Signed)
CSW notified patient and patient family regarding d/c today. Discharge summary and orders in patient chart.   Patient will be going to [facility] care which was family and patient's first choice, and  health care rep Juliann Pulse visited patient yesterday to confirm acceptance of bed.   Family member (daughter) and patient extremely upset regarding discharge and reported concerns regarding patient's cardiac tests.   CSW notified family member (daughter) that [place] would follow up with doctor and nurse regarding concerns.   Nurse reached out to Dr. Horris Latino who reports although Dr. Otho Ket DC summary and orders are in patient chart, patient will most likely not be discharging today.   CSW continue to follow until patient medically stable to discharge.   Alexandra Tran, [place]

## 2018-07-16 NOTE — Progress Notes (Signed)
Continuous Cardiac monitor placed.  HR 112.  Patient denies chest pain or shortness of breath.

## 2018-07-16 NOTE — Progress Notes (Deleted)
Patient will DC to: [place] date:07/16/18 Family notified: Otila Kluver  Transport [id number]  Per MD patient ready for DC to [facility] . RN, patient, patient's family, and facility notified of DC. Discharge Summary sent to facility. RN given number for report [phone]. DC packet on chart. Ambulance transport requested for patient.  CSW signing off.  [place], [place]

## 2018-07-16 NOTE — Progress Notes (Addendum)
CSW received voicemail from patient daughter regarding patient's SNF choice. Daughter reports on voicemail that patient has changed her mind and instead of going to [facility] would like to go to [facility] in [place].   CSW called [facility] who reports they can accept patient if discharged tomorrow.   CSW called daughter to relay message of acceptance to [facility]. Daughter named Linna Hoff was very rude and unpleasant to CSW, including demeaning comments and phrases.    CSW reiterated reason for the call was regarding discharge not medical issues with the patient and CSW directed Linna Hoff to contact nurse with any questions regarding medical issues. CSW ended conversation as daughter Linna Hoff continued to [employer] with vile comments.    [place], [place]

## 2018-07-16 NOTE — Progress Notes (Signed)
Patient remains tachycardic.  Angios CT was negative.  Internal medicine team recommends repeat hemoglobin to check tomorrow and to continue to observe with her tachycardia.  Will cancel transfer to SNF today and possible transfer to SNF tomorrow if she is stable.

## 2018-07-16 NOTE — Care Management Important Message (Signed)
Important Message  Patient Details  Name: Alexandra Tran MRN: [id number] Date of Birth: 02-28-1946   Medicare Important Message Given:  Yes    Erenest Rasher, RN 07/16/2018, 2:57 PM

## 2018-07-16 NOTE — Progress Notes (Signed)
Physical Therapy Treatment Patient Details Name: Alexandra Tran MRN: [id number] DOB: [birth year] Today's Date: 07/16/2018    History of Present Illness Pt is a 72 y/o female s/p elective R TKA. PMH includes DM, HTN, asthma, OSA on CPAP, and L carpal tunnel release.     PT Comments    Patient progressing this session with ambulation and activation with exercises.  She remains appropriate for STSNF rehab prior to d/c home as determined by MD.  PT to follow until d/c.   Follow Up Recommendations  Follow surgeon's recommendation for DC plan and follow-up therapies;Supervision for mobility/OOB     Equipment Recommendations  None recommended by PT    Recommendations for Other Services       Precautions / Restrictions Precautions Precautions: Knee Precaution Comments: Reviewed knee precautions and supine HEP.  Restrictions RLE Weight Bearing: Weight bearing as tolerated    Mobility  Bed Mobility Overal bed mobility: Needs Assistance Bed Mobility: Supine to Sit     Supine to sit: Min assist        Transfers Overall transfer level: Needs assistance Equipment used: Rolling walker (2 wheeled) Transfers: Sit to/from Stand Sit to Stand: Min assist         General transfer comment: increased time from bed, to/from commode and to recliner cues for R LE management and for hand placement  Ambulation/Gait Ambulation/Gait assistance: Min guard;Min assist Gait Distance (Feet): 50 Feet Assistive device: Rolling walker (2 wheeled) Gait Pattern/deviations: Step-to pattern;Decreased stride length;Antalgic     General Gait Details: cues for walker safety and posture   Stairs             Wheelchair Mobility    Modified Rankin (Stroke Patients Only)       Balance Overall balance assessment: Needs assistance Sitting-balance support: No upper extremity supported;Feet supported Sitting balance-Leahy Scale: Good     Standing balance support: Bilateral upper  extremity supported;During functional activity Standing balance-Leahy Scale: Good Standing balance comment: washed hands at sink with minguard A and leaning up against sink                            Cognition Arousal/Alertness: Awake/alert Behavior During Therapy: WFL for tasks assessed/performed Overall Cognitive Status: Within Functional Limits for tasks assessed                                        Exercises Total Joint Exercises Ankle Circles/Pumps: AROM;10 reps;Both;Supine Towel Squeeze: AROM;Both;Supine;10 reps Short Arc Quad: AAROM;10 reps;Right;Supine Heel Slides: AAROM;10 reps;Right;Supine Hip ABduction/ADduction: AROM;10 reps;Right;Supine Straight Leg Raises: AAROM;10 reps;Right;Supine Goniometric ROM: 5-50 in supine    General Comments        Pertinent Vitals/Pain Pain Assessment: 0-10 Pain Score: 5  Pain Location: R knee  Pain Descriptors / Indicators: Grimacing;Operative site guarding Pain Intervention(s): Repositioned;Monitored during session    Home Living                      Prior Function            PT Goals (current goals can now be found in the care plan section) Progress towards PT goals: Progressing toward goals    Frequency    7X/week      PT Plan Current plan remains appropriate    Co-evaluation  AM-PAC PT "6 Clicks" Daily Activity  Outcome Measure  Difficulty turning over in bed (including adjusting bedclothes, sheets and blankets)?: A Lot Difficulty moving from lying on back to sitting on the side of the bed? : Unable Difficulty sitting down on and standing up from a chair with arms (e.g., wheelchair, bedside commode, etc,.)?: Unable Help needed moving to and from a bed to chair (including a wheelchair)?: A Little Help needed walking in hospital room?: A Little Help needed climbing 3-5 steps with a railing? : A Lot 6 Click Score: 12    End of Session Equipment Utilized  During Treatment: Gait belt Activity Tolerance: Patient tolerated treatment well Patient left: with call bell/phone within reach;in chair   PT Visit Diagnosis: Other abnormalities of gait and mobility (R26.89);Unsteadiness on feet (R26.81);Pain Pain - Right/Left: Right Pain - part of body: Knee     Time: 0922-1002 PT Time Calculation (min) (ACUTE ONLY): 40 min  Charges:  $Gait Training: 8-22 mins $Therapeutic Exercise: 8-22 mins $Therapeutic Activity: 8-22 mins                     Alexandra Tran, [facility] [phone] 07/16/2018    Alexandra Tran 07/16/2018, 10:58 AM

## 2018-07-16 NOTE — Progress Notes (Signed)
PROGRESS NOTE  Alexandra Tran VQQ:[id number] DOB: [birth year] DOA: 07/13/2018 PCP: Caprice Renshaw, MD  HPI/Recap of past 24 hours: Alexandra Tran is an 72 y.o. female with hx of PVD, HTN and DM presenting for R TKR which was done on 10/14. TRH consulted due to tachycardia post op.   Today, pt still concerned about her HR. Around noon, was noted to have some nausea, and some epigastric burning which was relieved by antacids. Pt currently denies any further nausea, epigastric burning, chest pain, SOB, abdominal pain, fever/chills. Spoke in-depth with the daughter about patients management.  Assessment/Plan: Principal Problem:   S/P TKR (total knee replacement) using cement, right Active Problems:   Acute blood loss anemia   Tachycardia   Diabetes mellitus with hyperglycemia (HCC)   Essential hypertension  Post-op tachycardia HR 110's Repeat EKG sinus tachy Afebrile with resolved leukocytosis DD: ?acute blood post op, ruled out PE D-dimer elevated (likely due to recent surgery) CTA chest negative for PE ECHO showed EF of 55-60%, grade1DD, trivial pericardial effusion   S/P IVF Increased home metoprolol succinate to 50 mg daily Place on telemetry for closer monitoring  S/P TKR Management per ortho PT/OT  Normocytic anemia Hemoglobin noted to be dropping since post op, ?bleed  No signs of bleeding FOBT pending, type and screen Anemia panel Daily CBC, monitor closely  DM type 2 Continue SSI, lantus, accu-checks  HTN BP Soft Held cozaar, HCTZ, prn lasix      Code Status: Full  Family Communication: Spoke with daughter who is a Therapist, sports over the phone about overall management, all questions answered  Disposition Plan: SNF    Consultants:  TRH  Procedures:  S/P R TKR   Antimicrobials:  None   DVT prophylaxis: Held Lovenox due to drop in hemoglobin   Objective: Vitals:   07/15/18 2223 07/16/18 0457 07/16/18 0930 07/16/18 1400  BP: 130/62 (!)  125/58 123/65 111/81  Pulse: (!) 119 (!) 125 (!) 126 (!) 112  Resp: 18  18 18   Temp: 98.8 F (37.1 C) 98.3 F (36.8 C) 98.1 F (36.7 C) 98.3 F (36.8 C)  TempSrc: Oral Oral Oral Oral  SpO2: 99% 98% 96% 96%    Intake/Output Summary (Last 24 hours) at 07/16/2018 1738 Last data filed at 07/16/2018 1400 Gross per 24 hour  Intake 265 ml  Output -  Net 265 ml   There were no vitals filed for this visit.  Exam:   General:  NAD  Cardiovascular: S1, S2 present  Respiratory: CTAB  Abdomen: Soft, NT, ND, BS present   Musculoskeletal: RLE in ACE wrap, no pedal edema noted bilaterally  Skin: Normal  Psychiatry: Normal mood   Data Reviewed: CBC: Recent Labs  Lab 07/14/18 0322 07/15/18 0300 07/16/18 0251  WBC 8.4 10.7* 9.1  NEUTROABS  --   --  7.4  HGB 10.8* 9.7* 8.1*  HCT 33.2* 29.1* 25.0*  MCV 93.0 92.1 91.9  PLT 200 196 433   Basic Metabolic Panel: Recent Labs  Lab 07/14/18 0322 07/15/18 0300 07/16/18 0251  NA 134* 133* 130*  K 3.8 4.1 3.9  CL 99 103 101  CO2 24 25 24   GLUCOSE 227* 167* 170*  BUN 12 10 10   CREATININE 0.68 0.66 0.64  CALCIUM 8.7* 8.4* 7.9*   GFR: Estimated Creatinine Clearance: 74.1 mL/min (by C-G formula based on SCr of 0.64 mg/dL). Liver Function Tests: No results for input(s): AST, ALT, ALKPHOS, BILITOT, PROT, ALBUMIN in the last 168 hours. No  results for input(s): LIPASE, AMYLASE in the last 168 hours. No results for input(s): AMMONIA in the last 168 hours. Coagulation Profile: No results for input(s): INR, PROTIME in the last 168 hours. Cardiac Enzymes: No results for input(s): CKTOTAL, CKMB, CKMBINDEX, TROPONINI in the last 168 hours. BNP (last 3 results) No results for input(s): PROBNP in the last 8760 hours. HbA1C: No results for input(s): HGBA1C in the last 72 hours. CBG: Recent Labs  Lab 07/15/18 1210 07/15/18 1644 07/16/18 0500 07/16/18 1157 07/16/18 1630  GLUCAP 191* 187* 149* 200* 141*   Lipid Profile: No  results for input(s): CHOL, HDL, LDLCALC, TRIG, CHOLHDL, LDLDIRECT in the last 72 hours. Thyroid Function Tests: No results for input(s): TSH, T4TOTAL, FREET4, T3FREE, THYROIDAB in the last 72 hours. Anemia Panel: No results for input(s): VITAMINB12, FOLATE, FERRITIN, TIBC, IRON, RETICCTPCT in the last 72 hours. Urine analysis:    Component Value Date/Time   COLORURINE YELLOW 07/03/2018 [place] 07/03/2018 1204   LABSPEC 1.011 07/03/2018 1204   PHURINE 7.0 07/03/2018 1204   GLUCOSEU NEGATIVE 07/03/2018 1204   HGBUR NEGATIVE 07/03/2018 1204   BILIRUBINUR NEGATIVE 07/03/2018 1204   KETONESUR NEGATIVE 07/03/2018 1204   PROTEINUR NEGATIVE 07/03/2018 1204   NITRITE NEGATIVE 07/03/2018 1204   LEUKOCYTESUR NEGATIVE 07/03/2018 1204   Sepsis Labs: @LABRCNTIP (procalcitonin:4,lacticidven:4)  )No results found for this or any previous visit (from the past 240 hour(s)).    Studies: Ct Angio Chest Pe W Or Wo Contrast  Result Date: 07/15/2018 CLINICAL DATA:  72 year old female with recent knee surgery. Abnormal D-dimer. EXAM: CT ANGIOGRAPHY CHEST WITH CONTRAST TECHNIQUE: Multidetector CT imaging of the chest was performed using the standard protocol during bolus administration of intravenous contrast. Multiplanar CT image reconstructions and MIPs were obtained to evaluate the vascular anatomy. CONTRAST:  170mL ISOVUE-370 IOPAMIDOL (ISOVUE-370) INJECTION 76% COMPARISON:  Chest radiographs 07/03/2018. FINDINGS: Cardiovascular: Good contrast bolus timing in the pulmonary arterial tree. Respiratory motion artifact in the lower lobes. No pulmonary artery filling defect identified. Negative visible aorta aside from minor atherosclerosis. Calcified coronary artery atherosclerosis or stent is evident on series 7, image 162. Cardiac size at the upper limits of normal. No pericardial effusion. Mediastinum/Nodes: Mildly dilated esophagus. Gastric hiatal hernia and postoperative changes to the  stomach in the abdomen are noted. No mediastinal lymphadenopathy. Lungs/Pleura: Major airways are patent. No pleural effusion. No consolidation. Mild pulmonary mosaic attenuation, mostly in the upper lobes. Otherwise there is only minor dependent pulmonary atelectasis. Upper Abdomen: Retained low-density stool in the visible transverse colon. Negative visible liver, spleen, kidneys. Partially visible postoperative changes to the stomach, unclear whether there is a gastrojejunostomy. Musculoskeletal: No acute osseous abnormality identified. Review of the MIP images confirms the above findings. IMPRESSION: 1. Mild lower lobe respiratory motion artifact. No pulmonary embolus identified. 2. Mild gas trapping and atelectasis suspected in the lungs. 3. Coronary artery atherosclerosis. Mild cardiomegaly. Negative visible aorta. 4. Mildly dilated esophagus. Chronic postoperative changes to the stomach. Electronically Signed   By: Genevie Ann M.D.   On: 07/15/2018 19:36    Scheduled Meds: . aspirin EC  81 mg Oral BID PC  . cilostazol  100 mg Oral BID  . dorzolamide-timolol  1 drop Both Eyes BID  . ferrous sulfate  325 mg Oral BID WC  . gabapentin  100 mg Oral QHS  . hydrochlorothiazide  12.5 mg Oral Daily  . insulin aspart  0-15 Units Subcutaneous TID WC  . insulin aspart  3 Units Subcutaneous TID WC  .  insulin glargine  25 Units Subcutaneous QHS  . latanoprost  1 drop Both Eyes QHS  . linaclotide  290 mcg Oral QAC breakfast  . losartan  100 mg Oral Daily  . metoprolol succinate  50 mg Oral Daily  . montelukast  10 mg Oral BH-q7a  . multivitamin with minerals  1 tablet Oral Daily  . oxybutynin  5 mg Oral Daily  . pantoprazole  40 mg Oral Daily  . polyethylene glycol  17 g Oral BID  . potassium chloride SA  20 mEq Oral BID  . senna-docusate  2 tablet Oral BID  . traMADol  50 mg Oral Q6H  . Vitamin D (Ergocalciferol)  50,000 Units Oral Q7 days    Continuous Infusions: . methocarbamol (ROBAXIN) IV        LOS: 3 days     Alma Friendly, MD [facility]   If 7PM-7AM, please contact night-coverage [email or web address] 07/16/2018, 5:38 PM

## 2018-07-16 NOTE — Care Management Important Message (Signed)
Important Message  Patient Details  Name: Alexandra Tran MRN: [id number] Date of Birth: [birth year]   Medicare Important Message Given:  Yes    [name] 07/16/2018, 3:47 PM

## 2018-07-16 NOTE — Progress Notes (Signed)
   Subjective: 3 Days Post-Op Procedure(s) (LRB): RIGHT TOTAL KNEE ARTHROPLASTY (Right) Patient reports pain as moderate.    Objective: Vital signs in last 24 hours: Temp:  [97.7 F (36.5 C)-98.8 F (37.1 C)] 98.3 F (36.8 C) (10/17 0457) Pulse Rate:  [109-125] 125 (10/17 0457) Resp:  [18] 18 (10/16 2223) BP: (125-135)/(58-62) 125/58 (10/17 0457) SpO2:  [98 %-100 %] 98 % (10/17 0457)  Intake/Output from previous day: 10/16 0701 - 10/17 0700 In: 75 [P.O.:75] Out: 500 [Urine:500] Intake/Output this shift: No intake/output data recorded.  Recent Labs    07/14/18 0322 07/15/18 0300 07/16/18 0251  HGB 10.8* 9.7* 8.1*   Recent Labs    07/15/18 0300 07/16/18 0251  WBC 10.7* 9.1  RBC 3.16* 2.72*  HCT 29.1* 25.0*  PLT 196 186   Recent Labs    07/15/18 0300 07/16/18 0251  NA 133* 130*  K 4.1 3.9  CL 103 101  CO2 25 24  BUN 10 10  CREATININE 0.66 0.64  GLUCOSE 167* 170*  CALCIUM 8.4* 7.9*   No results for input(s): LABPT, INR in the last 72 hours.  in CPM,  working on quad contractions when not in CPM Ct Angio Chest Pe W Or Wo Contrast  Result Date: 07/15/2018 CLINICAL DATA:  72 year old female with recent knee surgery. Abnormal D-dimer. EXAM: CT ANGIOGRAPHY CHEST WITH CONTRAST TECHNIQUE: Multidetector CT imaging of the chest was performed using the standard protocol during bolus administration of intravenous contrast. Multiplanar CT image reconstructions and MIPs were obtained to evaluate the vascular anatomy. CONTRAST:  186mL ISOVUE-370 IOPAMIDOL (ISOVUE-370) INJECTION 76% COMPARISON:  Chest radiographs 07/03/2018. FINDINGS: Cardiovascular: Good contrast bolus timing in the pulmonary arterial tree. Respiratory motion artifact in the lower lobes. No pulmonary artery filling defect identified. Negative visible aorta aside from minor atherosclerosis. Calcified coronary artery atherosclerosis or stent is evident on series 7, image 162. Cardiac size at the upper limits of  normal. No pericardial effusion. Mediastinum/Nodes: Mildly dilated esophagus. Gastric hiatal hernia and postoperative changes to the stomach in the abdomen are noted. No mediastinal lymphadenopathy. Lungs/Pleura: Major airways are patent. No pleural effusion. No consolidation. Mild pulmonary mosaic attenuation, mostly in the upper lobes. Otherwise there is only minor dependent pulmonary atelectasis. Upper Abdomen: Retained low-density stool in the visible transverse colon. Negative visible liver, spleen, kidneys. Partially visible postoperative changes to the stomach, unclear whether there is a gastrojejunostomy. Musculoskeletal: No acute osseous abnormality identified. Review of the MIP images confirms the above findings. IMPRESSION: 1. Mild lower lobe respiratory motion artifact. No pulmonary embolus identified. 2. Mild gas trapping and atelectasis suspected in the lungs. 3. Coronary artery atherosclerosis. Mild cardiomegaly. Negative visible aorta. 4. Mildly dilated esophagus. Chronic postoperative changes to the stomach. Electronically Signed   By: Genevie Ann M.D.   On: 07/15/2018 19:36    Assessment/Plan: 3 Days Post-Op Procedure(s) (LRB): RIGHT TOTAL KNEE ARTHROPLASTY (Right) Discharge to SNF  Marybelle Killings 07/16/2018, 7:01 AM

## 2018-07-16 NOTE — Progress Notes (Signed)
Patient was medicated for co having indigestion symptoms, burning and burping up clear liquid.

## 2018-07-16 NOTE — Progress Notes (Signed)
Dr Horris Latino called and will get in touch with Ortho.  No new orders noted presently.  Have attempted to explain the situation with the patient but she continues to talk above the conversation and keeps saying "these people think that I am crazy".

## 2018-07-16 NOTE — Progress Notes (Signed)
Dr Horris Latino has been texted regarding discharge order and reconciliation for AVS.  Awaiting for return call.

## 2018-07-16 NOTE — Progress Notes (Addendum)
EKG done with reading of Sinus Tachycardia

## 2018-07-17 LAB — CBC WITH DIFFERENTIAL/PLATELET
Abs Immature Granulocytes: 0.04 10*3/uL (ref 0.00–0.07)
BASOS PCT: 0 %
Basophils Absolute: 0 10*3/uL (ref 0.0–0.1)
Eosinophils Absolute: 0.2 10*3/uL (ref 0.0–0.5)
Eosinophils Relative: 3 %
HCT: 26.5 % — ABNORMAL LOW (ref 36.0–46.0)
Hemoglobin: 8.7 g/dL — ABNORMAL LOW (ref 12.0–15.0)
IMMATURE GRANULOCYTES: 1 %
LYMPHS PCT: 9 %
Lymphs Abs: 0.8 10*3/uL (ref 0.7–4.0)
MCH: 30.1 pg (ref 26.0–34.0)
MCHC: 32.8 g/dL (ref 30.0–36.0)
MCV: 91.7 fL (ref 80.0–100.0)
MONOS PCT: 9 %
Monocytes Absolute: 0.7 10*3/uL (ref 0.1–1.0)
Neutro Abs: 6.4 10*3/uL (ref 1.7–7.7)
Neutrophils Relative %: 78 %
PLATELETS: 217 10*3/uL (ref 150–400)
RBC: 2.89 MIL/uL — ABNORMAL LOW (ref 3.87–5.11)
RDW: 12.9 % (ref 11.5–15.5)
WBC: 8.2 10*3/uL (ref 4.0–10.5)
nRBC: 0 % (ref 0.0–0.2)

## 2018-07-17 LAB — IRON AND TIBC
Iron: 15 ug/dL — ABNORMAL LOW (ref 28–170)
SATURATION RATIOS: 8 % — AB (ref 10.4–31.8)
TIBC: 186 ug/dL — AB (ref 250–450)
UIBC: 171 ug/dL

## 2018-07-17 LAB — GLUCOSE, CAPILLARY
Glucose-Capillary: 95 mg/dL (ref 70–99)
Glucose-Capillary: 150 mg/dL — ABNORMAL HIGH (ref 70–99)

## 2018-07-17 LAB — FERRITIN: Ferritin: 204 ng/mL (ref 11–307)

## 2018-07-17 LAB — VITAMIN B12: Vitamin B-12: 3923 pg/mL — ABNORMAL HIGH (ref 180–914)

## 2018-07-17 LAB — FOLATE: FOLATE: 16 ng/mL (ref >5.9)

## 2018-07-17 MED ORDER — SODIUM CHLORIDE 0.9 % IV SOLN
510.0000 mg | Freq: Once | INTRAVENOUS | Status: AC
  Administered 2018-07-17: 510 mg via INTRAVENOUS
  Filled 2018-07-17: qty 17

## 2018-07-17 MED ORDER — SENNOSIDES-DOCUSATE SODIUM 8.6-50 MG PO TABS: 1.0000 | ORAL_TABLET | Freq: Two times a day (BID) | ORAL | Status: AC

## 2018-07-17 MED ORDER — ASPIRIN 81 MG PO TBEC: 81.0000 mg | DELAYED_RELEASE_TABLET | Freq: Two times a day (BID) | ORAL | 1 refills | Status: AC

## 2018-07-17 MED ORDER — METOPROLOL SUCCINATE ER 50 MG PO TB24: 50.0000 mg | ORAL_TABLET | Freq: Every day | ORAL | Status: AC

## 2018-07-17 MED ORDER — METHOCARBAMOL 500 MG PO TABS: 500.0000 mg | ORAL_TABLET | Freq: Four times a day (QID) | ORAL | 1 refills | Status: DC | PRN

## 2018-07-17 MED ORDER — FERROUS SULFATE 325 (65 FE) MG PO TABS: 325.0000 mg | ORAL_TABLET | Freq: Two times a day (BID) | ORAL | 1 refills | Status: DC

## 2018-07-17 NOTE — Clinical Social Work Placement (Signed)
   CLINICAL SOCIAL WORK PLACEMENT  NOTE  Date:  07/17/2018  Patient Details  Name: Alexandra Tran MRN: [id number] Date of Birth: 1946/04/21  Clinical Social Work is seeking post-discharge placement for this patient at the [place] level of care (*CSW will initial, date and re-position this form in  chart as items are completed):  Yes   Patient/family provided with [facility] Work Department's list of facilities offering this level of care within the geographic area requested by the patient (or if unable, by the patient's family).  Yes   Patient/family informed of their freedom to choose among providers that offer the needed level of care, that participate in Medicare, Medicaid or managed care program needed by the patient, have an available bed and are willing to accept the patient.      Patient/family informed of [facility]'s ownership interest in [facility] and [facility], as well as of the fact that they are under no obligation to receive care at these facilities.  PASRR submitted to EDS on       PASRR number received on 07/14/18     Existing PASRR number confirmed on       FL2 transmitted to all facilities in geographic area requested by pt/family on 07/14/18     FL2 transmitted to all facilities within larger geographic area on       Patient informed that his/her managed care company has contracts with or will negotiate with certain facilities, including the following:        Yes   Patient/family informed of bed offers received.  Patient chooses bed at [facility]     Physician recommends and patient chooses bed at      Patient to be transferred to [facility] on 07/17/18.  Patient to be transferred to facility by [facility]     Patient family notified on 07/17/18 of transfer.  Name of family member notified:  daughter Linna Hoff     PHYSICIAN       Additional Comment:     _______________________________________________ Alberteen Sam, LCSW 07/17/2018, 2:46 PM

## 2018-07-17 NOTE — Progress Notes (Signed)
   Subjective: 4 Days Post-Op Procedure(s) (LRB): RIGHT TOTAL KNEE ARTHROPLASTY (Right) Patient reports pain as mild and moderate.    Objective: Vital signs in last 24 hours: Temp:  [98 F (36.7 C)-98.1 F (36.7 C)] 98 F (36.7 C) (10/18 0555) Pulse Rate:  [112-116] 112 (10/18 0555) Resp:  [18] 18 (10/18 0555) BP: (140-143)/(63-73) 143/73 (10/18 0555) SpO2:  [98 %-100 %] 100 % (10/18 0555)  Intake/Output from previous day: 10/17 0701 - 10/18 0700 In: 555 [P.O.:555] Out: -  Intake/Output this shift: Total I/O In: 240 [P.O.:240] Out: -   Recent Labs    07/15/18 0300 07/16/18 0251 07/17/18 0108  HGB 9.7* 8.1* 8.7*   Recent Labs    07/16/18 0251 07/17/18 0108  WBC 9.1 8.2  RBC 2.72* 2.89*  HCT 25.0* 26.5*  PLT 186 217   Recent Labs    07/15/18 0300 07/16/18 0251  NA 133* 130*  K 4.1 3.9  CL 103 101  CO2 25 24  BUN 10 10  CREATININE 0.66 0.64  GLUCOSE 167* 170*  CALCIUM 8.4* 7.9*   No results for input(s): LABPT, INR in the last 72 hours.  Neurologically intact, ace wrap removed.  Still tachy, hgb improved.  No results found.  Assessment/Plan: 4 Days Post-Op Procedure(s) (LRB): RIGHT TOTAL KNEE ARTHROPLASTY (Right) Plan:  Stable per medicine service. Hemoglobin increased. Ready for transfer to SNF. Discussed with her daughter who is a NP and at bedside.   Alexandra Tran 07/17/2018, 2:03 PM

## 2018-07-17 NOTE — Progress Notes (Signed)
Physical Therapy Treatment Patient Details Name: Alexandra Tran MRN: [id number] DOB: 1946/07/18 Today's Date: 07/17/2018    History of Present Illness Pt is a 72 y/o female s/p elective R TKA. PMH includes DM, HTN, asthma, OSA on CPAP, and L carpal tunnel release.     PT Comments    Patient progressing with ambulation distance and slowly with knee AROM.  Feel she remains fall risk due to upper trunk and arm weakness with walker and flexed posture over time.  Remains appropriate for SNF level rehab upon d/c.    Follow Up Recommendations  Follow surgeon's recommendation for DC plan and follow-up therapies;Supervision for mobility/OOB     Equipment Recommendations  None recommended by PT    Recommendations for Other Services       Precautions / Restrictions Precautions Precautions: Knee Restrictions RLE Weight Bearing: Weight bearing as tolerated    Mobility  Bed Mobility Overal bed mobility: Needs Assistance       Supine to sit: Supervision;HOB elevated;Min guard     General bed mobility comments: able to get up to EOB increased time and cues for R LE management  Transfers Overall transfer level: Needs assistance Equipment used: Rolling walker (2 wheeled) Transfers: Sit to/from Stand Sit to Stand: Min guard         General transfer comment: for balance/safety  Ambulation/Gait Ambulation/Gait assistance: Min assist;Min guard Gait Distance (Feet): 100 Feet Assistive device: Rolling walker (2 wheeled) Gait Pattern/deviations: Step-to pattern;Trunk flexed;Antalgic     General Gait Details: at times increased distance from walker and flexed posture, stops to stand to improve posture, but over time returns to flexed posture, needs assist for safety/fall prevention   Stairs             Wheelchair Mobility    Modified Rankin (Stroke Patients Only)       Balance Overall balance assessment: Needs assistance Sitting-balance support: No upper  extremity supported;Feet supported Sitting balance-Leahy Scale: Good     Standing balance support: During functional activity;No upper extremity supported Standing balance-Leahy Scale: Poor Standing balance comment: washed hands at sink with minguard A and leaning up against sink                            Cognition Arousal/Alertness: Awake/alert Behavior During Therapy: WFL for tasks assessed/performed Overall Cognitive Status: Within Functional Limits for tasks assessed                                        Exercises Total Joint Exercises Ankle Circles/Pumps: AROM;10 reps;Both;Supine Quad Sets: AROM;10 reps;Both;Supine Towel Squeeze: AROM;Both;Supine;10 reps Short Arc Quad: AAROM;10 reps;Right;Supine Heel Slides: AAROM;10 reps;Right;Supine Goniometric ROM: 5-60 in supine    General Comments General comments (skin integrity, edema, etc.): daughter present      Pertinent Vitals/Pain Pain Score: 6  Pain Location: R knee  Pain Descriptors / Indicators: Guarding;Sore Pain Intervention(s): Monitored during session;Repositioned;Ice applied    Home Living                      Prior Function            PT Goals (current goals can now be found in the care plan section) Progress towards PT goals: Progressing toward goals    Frequency    7X/week      PT Plan Current plan  remains appropriate    Co-evaluation              AM-PAC PT "6 Clicks" Daily Activity  Outcome Measure  Difficulty turning over in bed (including adjusting bedclothes, sheets and blankets)?: A Little Difficulty moving from lying on back to sitting on the side of the bed? : Unable Difficulty sitting down on and standing up from a chair with arms (e.g., wheelchair, bedside commode, etc,.)?: Unable Help needed moving to and from a bed to chair (including a wheelchair)?: A Little Help needed walking in hospital room?: A Little Help needed climbing 3-5 steps  with a railing? : A Lot 6 Click Score: 13    End of Session Equipment Utilized During Treatment: Gait belt Activity Tolerance: Patient tolerated treatment well Patient left: in chair;with call bell/phone within reach;with family/visitor present   PT Visit Diagnosis: Other abnormalities of gait and mobility (R26.89);Unsteadiness on feet (R26.81);Pain Pain - Right/Left: Right Pain - part of body: Knee     Time: 1230-1302 PT Time Calculation (min) (ACUTE ONLY): 32 min  Charges:  $Gait Training: 8-22 mins $Therapeutic Exercise: 8-22 mins                     [name], [place] [facility] [phone] 07/17/2018    [name] 07/17/2018, 3:22 PM

## 2018-07-17 NOTE — Progress Notes (Signed)
Patient will DC to: [facility] Anticipated DC date: 07/17/18 Family notified:Anna Selinda Flavin Transport by: Corey Harold  Per MD patient ready for DC to [facility] . RN, patient, patient's family, and facility notified of DC. Discharge Summary sent to facility. RN given number for report  [phone] Room 325. DC packet on chart. Ambulance transport requested for patient.  CSW signing off.  Bryson, [place]

## 2018-07-17 NOTE — Progress Notes (Signed)
RN called report to Mickel Baas at [facility]. Answered all questions to satisfaction. Pt's belongings gathered by daughter to go with her.

## 2018-07-17 NOTE — Progress Notes (Signed)
PROGRESS NOTE  MILCAH DULANY QPY:[id number] DOB: 02-13-1946 DOA: 07/13/2018 PCP: Caprice Renshaw, MD  HPI/Recap of past 24 hours: DESTANIE TIBBETTS is an 72 y.o. female with hx of PVD, HTN and DM presenting for R TKR which was done on 10/14. TRH consulted due to tachycardia post op.   Today, pt reported feeling much better, met pt sitting in bed, was able to ambulate in the hallway. HR improved some. Stable to be discharged to SNF.  Assessment/Plan: Principal Problem:   S/P TKR (total knee replacement) using cement, right Active Problems:   Acute blood loss anemia   Tachycardia   Diabetes mellitus with hyperglycemia (HCC)   Essential hypertension  Post-op tachycardia HR around 106-110 Likely due to ?iron def anemia/acute blood loss anemia Repeat EKG sinus tachy Afebrile with resolved leukocytosis D-dimer elevated (likely due to recent surgery) CTA chest negative for PE ECHO showed EF of 55-60%, grade1DD, trivial pericardial effusion   S/P IVF Increased home metoprolol succinate to 50 mg daily, continue  S/P TKR Management per ortho PT/OT  Iron def/normocytic anemia No signs of bleeding Anemia panel: iron 15, sats 8%, ferritin 204, folate 16, Vit B12 3,923 S/P IV feraheme X 1 on 07/17/18, please give another dose on 07/24/18  Repeat CBC in 3 days  DM type 2 Continue SSI, lantus, accu-checks  HTN BP Stable Continue cozaar, HCTZ, prn lasix      Code Status: Full  Family Communication: Spoke with daughter about overall management, all questions answered  Disposition Plan: SNF    Consultants:  TRH  Procedures:  S/P R TKR   Antimicrobials:  None   DVT prophylaxis: Held Lovenox due to drop in hemoglobin   Objective: Vitals:   07/16/18 0930 07/16/18 1400 07/16/18 2052 07/17/18 0555  BP: 123/65 111/81 140/63 (!) 143/73  Pulse: (!) 126 (!) 112 (!) 116 (!) 112  Resp: 18 18 18 18   Temp: 98.1 F (36.7 C) 98.3 F (36.8 C) 98.1 F (36.7 C)  98 F (36.7 C)  TempSrc: Oral Oral Oral Oral  SpO2: 96% 96% 98% 100%    Intake/Output Summary (Last 24 hours) at 07/17/2018 1351 Last data filed at 07/17/2018 0856 Gross per 24 hour  Intake 555 ml  Output -  Net 555 ml   There were no vitals filed for this visit.  Exam:   General:  NAD  Cardiovascular: S1, S2 present  Respiratory: CTAB  Abdomen: Soft, NT, ND, BS present   Musculoskeletal: No pedal edema noted bilaterally  Skin: Normal  Psychiatry: Normal mood   Data Reviewed: CBC: Recent Labs  Lab 07/14/18 0322 07/15/18 0300 07/16/18 0251 07/17/18 0108  WBC 8.4 10.7* 9.1 8.2  NEUTROABS  --   --  7.4 6.4  HGB 10.8* 9.7* 8.1* 8.7*  HCT 33.2* 29.1* 25.0* 26.5*  MCV 93.0 92.1 91.9 91.7  PLT 200 196 186 124   Basic Metabolic Panel: Recent Labs  Lab 07/14/18 0322 07/15/18 0300 07/16/18 0251  NA 134* 133* 130*  K 3.8 4.1 3.9  CL 99 103 101  CO2 24 25 24   GLUCOSE 227* 167* 170*  BUN 12 10 10   CREATININE 0.68 0.66 0.64  CALCIUM 8.7* 8.4* 7.9*   GFR: CrCl cannot be calculated (Unknown ideal weight.). Liver Function Tests: No results for input(s): AST, ALT, ALKPHOS, BILITOT, PROT, ALBUMIN in the last 168 hours. No results for input(s): LIPASE, AMYLASE in the last 168 hours. No results for input(s): AMMONIA in the last 168 hours.  Coagulation Profile: No results for input(s): INR, PROTIME in the last 168 hours. Cardiac Enzymes: No results for input(s): CKTOTAL, CKMB, CKMBINDEX, TROPONINI in the last 168 hours. BNP (last 3 results) No results for input(s): PROBNP in the last 8760 hours. HbA1C: No results for input(s): HGBA1C in the last 72 hours. CBG: Recent Labs  Lab 07/16/18 1157 07/16/18 1630 07/16/18 2129 07/17/18 0633 07/17/18 1216  GLUCAP 200* 141* 154* 95 150*   Lipid Profile: No results for input(s): CHOL, HDL, LDLCALC, TRIG, CHOLHDL, LDLDIRECT in the last 72 hours. Thyroid Function Tests: No results for input(s): TSH, T4TOTAL,  FREET4, T3FREE, THYROIDAB in the last 72 hours. Anemia Panel: Recent Labs    07/17/18 0108  VITAMINB12 3,923*  FOLATE 16.0  FERRITIN 204  TIBC 186*  IRON 15*   Urine analysis:    Component Value Date/Time   COLORURINE YELLOW 07/03/2018 [place] 07/03/2018 1204   LABSPEC 1.011 07/03/2018 1204   PHURINE 7.0 07/03/2018 1204   GLUCOSEU NEGATIVE 07/03/2018 1204   HGBUR NEGATIVE 07/03/2018 1204   BILIRUBINUR NEGATIVE 07/03/2018 1204   KETONESUR NEGATIVE 07/03/2018 1204   PROTEINUR NEGATIVE 07/03/2018 1204   NITRITE NEGATIVE 07/03/2018 1204   LEUKOCYTESUR NEGATIVE 07/03/2018 1204   Sepsis Labs: @LABRCNTIP (procalcitonin:4,lacticidven:4)  )No results found for this or any previous visit (from the past 240 hour(s)).    Studies: No results found.  Scheduled Meds: . aspirin EC  81 mg Oral BID PC  . cilostazol  100 mg Oral BID  . dorzolamide-timolol  1 drop Both Eyes BID  . ferrous sulfate  325 mg Oral BID WC  . gabapentin  100 mg Oral QHS  . insulin aspart  0-15 Units Subcutaneous TID WC  . insulin aspart  3 Units Subcutaneous TID WC  . insulin glargine  25 Units Subcutaneous QHS  . latanoprost  1 drop Both Eyes QHS  . linaclotide  290 mcg Oral QAC breakfast  . metoprolol succinate  50 mg Oral Daily  . montelukast  10 mg Oral BH-q7a  . multivitamin with minerals  1 tablet Oral Daily  . oxybutynin  5 mg Oral Daily  . pantoprazole  40 mg Oral Daily  . polyethylene glycol  17 g Oral BID  . potassium chloride SA  20 mEq Oral BID  . senna-docusate  1 tablet Oral BID  . traMADol  50 mg Oral Q6H  . Vitamin D (Ergocalciferol)  50,000 Units Oral Q7 days    Continuous Infusions: . methocarbamol (ROBAXIN) IV       LOS: 4 days     Alma Friendly, MD [facility]   If 7PM-7AM, please contact night-coverage [email or web address] 07/17/2018, 1:51 PM

## 2018-07-17 NOTE — Progress Notes (Signed)
Patient called nurse to use the bathroom. Patient waits to the last minute before calling for help to use the bathroom. Upon arrival patient yelled at nurse " I got to go the bathroom and I need help" . As soon as she got up she started urinating on the floor before making it to the bathroom. This has happened on 3 different occasions since beginning of the shift. Patient made aware to call for help in advance and not to wait for the last minute before calling for help. Patient verbalizes understanding.

## 2018-07-17 NOTE — Plan of Care (Signed)
  Problem: Elimination: Goal: Will not experience complications related to urinary retention Outcome: Completed/Met   Problem: Pain Managment: Goal: General experience of comfort will improve Outcome: Progressing   Problem: Safety: Goal: Ability to remain free from injury will improve Outcome: Progressing

## 2018-07-27 ENCOUNTER — Encounter (INDEPENDENT_AMBULATORY_CARE_PROVIDER_SITE_OTHER): Payer: Self-pay | Admitting: Surgery

## 2018-07-27 ENCOUNTER — Ambulatory Visit (INDEPENDENT_AMBULATORY_CARE_PROVIDER_SITE_OTHER): Payer: Medicare Other | Admitting: Surgery

## 2018-07-27 ENCOUNTER — Other Ambulatory Visit (INDEPENDENT_AMBULATORY_CARE_PROVIDER_SITE_OTHER): Payer: Self-pay

## 2018-07-27 ENCOUNTER — Ambulatory Visit (INDEPENDENT_AMBULATORY_CARE_PROVIDER_SITE_OTHER): Payer: No Typology Code available for payment source

## 2018-07-27 DIAGNOSIS — Z96651 Presence of right artificial knee joint: Secondary | ICD-10-CM

## 2018-07-27 NOTE — Progress Notes (Signed)
   Post-Op Visit Note   Patient: Alexandra Tran           Date of Birth: 10-Jun-1946           MRN: [id number] Visit Date: 07/27/2018 PCP: Caprice Renshaw, MD   Assessment & Plan:  Chief Complaint:  Chief Complaint  Patient presents with  . Right Knee - Pain, Routine Post Op   Visit Diagnoses:  1. S/P total knee replacement, right     Plan: Patient will follow-up with me Friday morning for wound check and possible staple removal.  Advised patient and daughter was present that she should not be applying CBD or Voltaren gel anywhere near her right knee.  RN at facility to perform daily wound checks and dry dressing changes.  Continue PT protocol.  Follow-Up Instructions: Return in about 4 days (around 07/31/2018) for Friday morning with Jeneen Rinks for wound check and possible staple removal.   Orders:  Orders Placed This Encounter  Procedures  . XR Knee 1-2 Views Right   No orders of the defined types were placed in this encounter.   Imaging: No results found.  PMFS History: Patient Active Problem List   Diagnosis Date Noted  . Acute blood loss anemia 07/14/2018    Class: Acute  . Tachycardia 07/14/2018  . Diabetes mellitus with hyperglycemia ([place]) 07/14/2018  . Essential hypertension 07/14/2018  . Unilateral primary osteoarthritis, right knee 07/13/2018    Class: Chronic  . Peripheral vascular disease of extremity ([place]) 07/13/2018    Class: Chronic  . S/P TKR (total knee replacement) using cement, right 07/13/2018   Past Medical History:  Diagnosis Date  . Complication of anesthesia   . Diabetes mellitus without complication ([place])   . DJD (degenerative joint disease)   . Hypertension   . PONV (postoperative nausea and vomiting)   . PVD (peripheral vascular disease) ([place])     History reviewed. No pertinent family history.  Past Surgical History:  Procedure Laterality Date  . ABDOMINAL HYSTERECTOMY    . BREAST EXCISIONAL BIOPSY Right    negative  . CATARACT  EXTRACTION, BILATERAL    . EYE SURGERY    . GASTRIC BYPASS  2001  . TOTAL KNEE ARTHROPLASTY Right 07/13/2018  . TOTAL KNEE ARTHROPLASTY Right 07/13/2018   Procedure: RIGHT TOTAL KNEE ARTHROPLASTY;  Surgeon: Jessy Oto, MD;  Location: [place];  Service: Orthopedics;  Laterality: Right;  Needs RNFA   Social History   Occupational History  . Occupation: retired  Tobacco Use  . Smoking status: Never Smoker  . Smokeless tobacco: Never Used  Substance and Sexual Activity  . Alcohol use: No  . Drug use: No    Comment: uses CBD oil on her knee  . Sexual activity: Not on file   Exam Very pleasant white female alert and oriented in no acute distress.  Knee staples intact.  There is a small area at the distal third of her incision that is not completely healed.  Wound edges with slight separation but staples keeping this intact.  No drainage or signs of infection.  Knee range of motion about 10 to 80 degrees.  Calf nontender.  Neurovascular intact.

## 2018-07-31 ENCOUNTER — Telehealth (INDEPENDENT_AMBULATORY_CARE_PROVIDER_SITE_OTHER): Payer: Self-pay | Admitting: Specialist

## 2018-07-31 ENCOUNTER — Ambulatory Visit (INDEPENDENT_AMBULATORY_CARE_PROVIDER_SITE_OTHER): Payer: Medicare Other | Admitting: Surgery

## 2018-07-31 ENCOUNTER — Encounter (INDEPENDENT_AMBULATORY_CARE_PROVIDER_SITE_OTHER): Payer: Self-pay | Admitting: Surgery

## 2018-07-31 DIAGNOSIS — Z96651 Presence of right artificial knee joint: Secondary | ICD-10-CM

## 2018-07-31 NOTE — Progress Notes (Signed)
72 year-old black female female who is a couple weeks status post right total knee replacement returns for wound check.  States that she is doing well.  Continues to progress.   Exam Wound looks good.  Staples removed and Steri-Strips applied.  No drainage or signs of infection.   Plan Continue PT protocol.  Follow-up in 4 weeks with Dr. Lorin Mercy for recheck.

## 2018-07-31 NOTE — Telephone Encounter (Signed)
Patient saw Jeneen Rinks today and was told to follow up in 4 weeks, please add patient to cancellation list for opening around the first week in December. Patients # [phone]

## 2018-08-03 ENCOUNTER — Inpatient Hospital Stay: Payer: No Typology Code available for payment source

## 2018-08-03 ENCOUNTER — Inpatient Hospital Stay: Payer: No Typology Code available for payment source | Attending: Oncology | Admitting: Oncology

## 2018-08-03 ENCOUNTER — Encounter: Payer: Self-pay | Admitting: Oncology

## 2018-08-03 ENCOUNTER — Other Ambulatory Visit: Payer: Self-pay

## 2018-08-03 VITALS — BP 126/81 | HR 89 | Temp 96.9°F | Resp 18 | Wt 212.0 lb

## 2018-08-03 DIAGNOSIS — Z803 Family history of malignant neoplasm of breast: Secondary | ICD-10-CM | POA: Diagnosis not present

## 2018-08-03 DIAGNOSIS — D649 Anemia, unspecified: Secondary | ICD-10-CM

## 2018-08-03 DIAGNOSIS — Z8042 Family history of malignant neoplasm of prostate: Secondary | ICD-10-CM

## 2018-08-03 DIAGNOSIS — I10 Essential (primary) hypertension: Secondary | ICD-10-CM

## 2018-08-03 DIAGNOSIS — E119 Type 2 diabetes mellitus without complications: Secondary | ICD-10-CM | POA: Diagnosis not present

## 2018-08-03 LAB — CBC WITH DIFFERENTIAL/PLATELET
Abs Immature Granulocytes: 0.04 10*3/uL (ref 0.00–0.07)
BASOS ABS: 0.1 10*3/uL (ref 0.0–0.1)
BASOS PCT: 1 %
EOS PCT: 3 %
Eosinophils Absolute: 0.2 10*3/uL (ref 0.0–0.5)
HEMATOCRIT: 33.1 % — AB (ref 36.0–46.0)
Hemoglobin: 10.5 g/dL — ABNORMAL LOW (ref 12.0–15.0)
Immature Granulocytes: 1 %
Lymphocytes Relative: 15 %
Lymphs Abs: 1 10*3/uL (ref 0.7–4.0)
MCH: 30.6 pg (ref 26.0–34.0)
MCHC: 31.7 g/dL (ref 30.0–36.0)
MCV: 96.5 fL (ref 80.0–100.0)
MONOS PCT: 7 %
Monocytes Absolute: 0.5 10*3/uL (ref 0.1–1.0)
NRBC: 0 % (ref 0.0–0.2)
Neutro Abs: 5 10*3/uL (ref 1.7–7.7)
Neutrophils Relative %: 73 %
Platelets: 439 10*3/uL — ABNORMAL HIGH (ref 150–400)
RBC: 3.43 MIL/uL — ABNORMAL LOW (ref 3.87–5.11)
RDW: 14.1 % (ref 11.5–15.5)
WBC: 6.8 10*3/uL (ref 4.0–10.5)

## 2018-08-03 LAB — HEPATIC FUNCTION PANEL
ALT: 22 U/L (ref 0–44)
AST: 21 U/L (ref 15–41)
Albumin: 3.6 g/dL (ref 3.5–5.0)
Alkaline Phosphatase: 98 U/L (ref 38–126)
BILIRUBIN TOTAL: 0.5 mg/dL (ref 0.3–1.2)
Total Protein: 7.4 g/dL (ref 6.5–8.1)

## 2018-08-03 LAB — RETIC PANEL
IMMATURE RETIC FRACT: 15 % (ref 2.3–15.9)
RBC.: 3.43 MIL/uL — AB (ref 3.87–5.11)
Retic Count, Absolute: 114.2 10*3/uL (ref 19.0–186.0)
Retic Ct Pct: 3.3 % — ABNORMAL HIGH (ref 0.4–3.1)
Reticulocyte Hemoglobin: 31.3 pg (ref >27.9)

## 2018-08-03 LAB — TSH: TSH: 1.168 u[IU]/mL (ref 0.350–4.500)

## 2018-08-03 LAB — TECHNOLOGIST SMEAR REVIEW

## 2018-08-03 LAB — LACTATE DEHYDROGENASE: LDH: 155 U/L (ref 98–192)

## 2018-08-03 NOTE — Progress Notes (Signed)
Patient here for initial visit. °

## 2018-08-03 NOTE — Telephone Encounter (Signed)
I have put her on the cancellation list 

## 2018-08-04 LAB — HAPTOGLOBIN: HAPTOGLOBIN: 166 mg/dL (ref 34–200)

## 2018-08-04 NOTE — Progress Notes (Signed)
Hematology/Oncology Consult note [facility] Telephone:([phone] [phone]   Patient Care Team: Caprice Renshaw, MD as PCP - General (Internal Medicine)  REFERRING PROVIDER: [facility] nursing home.  CHIEF COMPLAINTS/REASON FOR VISIT:  Evaluation of anemia  HISTORY OF PRESENTING ILLNESS:  Alexandra Tran is a  72 y.o.  female with PMH listed below who was referred to me for evaluation of anemia. Patient was recently status post right total knee replacement.  Found to have anemia. 07/14/2018, CBC showed hemoglobin 10.8, hemoglobin gradually declines over her hospitalization to 8.7. Iron panel 07/17/2018 showed saturation ratio 8, TIBC 186, ferritin 200 04.  Folate level 16, vitamin B12 3923 She was given IV iron and was referred to heme-onc for further evaluation. Patient tells me that her impression is she is coming today to get more IV iron.  After being told by our nurse that assessment first today and will not get IV iron today, she feels upset.  She says" I am not happy and I am not getting any today".  They told me I am getting iron today"."  My daughter is a Designer, jewellery".   She reports that her iron has always been low had got IV iron infusion in the past.  She has a history gastric bypass.  Reports feeling tired, fatigued.  She denies shortness of breath with exertion chronic baseline.  Report right knee still feels tender Currently she lives in [facility] home.      Review of Systems  Constitutional: Positive for malaise/fatigue. Negative for chills, fever and weight loss.  HENT: Negative for nosebleeds and sore throat.   Eyes: Negative for double vision, photophobia and redness.  Respiratory: Negative for cough, hemoptysis and wheezing.   Cardiovascular: Negative for chest pain, palpitations and orthopnea.  Gastrointestinal: Positive for constipation. Negative for abdominal pain, blood in stool, nausea and  vomiting.  Genitourinary: Negative for dysuria.  Musculoskeletal: Negative for back pain, myalgias and neck pain.       Right knee pain  Skin: Negative for itching and rash.  Neurological: Positive for weakness. Negative for dizziness, tingling, tremors and focal weakness.  Endo/Heme/Allergies: Negative for environmental allergies. Does not bruise/bleed easily.  Psychiatric/Behavioral: Negative for hallucinations.    MEDICAL HISTORY:  Past Medical History:  Diagnosis Date  . Complication of anesthesia   . Diabetes mellitus without complication ([place])   . DJD (degenerative joint disease)   . Hypertension   . PONV (postoperative nausea and vomiting)   . PVD (peripheral vascular disease) ([place])     SURGICAL HISTORY: Past Surgical History:  Procedure Laterality Date  . ABDOMINAL HYSTERECTOMY    . BREAST EXCISIONAL BIOPSY Right    negative  . CATARACT EXTRACTION, BILATERAL    . EYE SURGERY    . GASTRIC BYPASS  2001  . TOTAL KNEE ARTHROPLASTY Right 07/13/2018  . TOTAL KNEE ARTHROPLASTY Right 07/13/2018   Procedure: RIGHT TOTAL KNEE ARTHROPLASTY;  Surgeon: Jessy Oto, MD;  Location: [facility];  Service: Orthopedics;  Laterality: Right;  Needs RNFA    SOCIAL HISTORY: Social History   Socioeconomic History  . Marital status: Widowed    Spouse name: Not on file  . Number of children: Not on file  . Years of education: Not on file  . Highest education level: Not on file  Occupational History  . Occupation: retired  Scientific laboratory technician  . Financial resource strain: Not on file  . Food insecurity:    Worry:  Not on file    Inability: Not on file  . Transportation needs:    Medical: Not on file    Non-medical: Not on file  Tobacco Use  . Smoking status: Never Smoker  . Smokeless tobacco: Never Used  Substance and Sexual Activity  . Alcohol use: No  . Drug use: No    Comment: uses CBD oil on her knee  . Sexual activity: Not on file  Lifestyle  . Physical activity:    Days per  week: Not on file    Minutes per session: Not on file  . Stress: Not on file  Relationships  . Social connections:    Talks on phone: Not on file    Gets together: Not on file    Attends religious service: Not on file    Active member of club or organization: Not on file    Attends meetings of clubs or organizations: Not on file    Relationship status: Not on file  . Intimate partner violence:    Fear of current or ex partner: Not on file    Emotionally abused: Not on file    Physically abused: Not on file    Forced sexual activity: Not on file  Other Topics Concern  . Not on file  Social History Narrative  . Not on file    FAMILY HISTORY: Family History  Problem Relation Age of Onset  . Diabetes Mother   . Prostate cancer Father   . Heart disease Brother   . Breast cancer Daughter     ALLERGIES:  is allergic to hydrocodone-acetaminophen and nsaids.  MEDICATIONS:  Current Outpatient Medications  Medication Sig Dispense Refill  . albuterol (PROVENTIL HFA;VENTOLIN HFA) 108 (90 Base) MCG/ACT inhaler Inhale 1-2 puffs into the lungs every 4 (four) hours as needed for wheezing or shortness of breath.     Marland Kitchen aspirin 81 MG EC tablet Take 1 tablet (81 mg total) by mouth 2 (two) times daily after a meal. 30 tablet 1  . cilostazol (PLETAL) 100 MG tablet Take 100 mg by mouth 2 (two) times daily.     . diclofenac sodium (VOLTAREN) 1 % GEL Apply 1 application topically 3 (three) times daily as needed (pain).    . dorzolamide-timolol (COSOPT) 22.3-6.8 MG/ML ophthalmic solution Place 1 drop into both eyes 2 (two) times daily.    . ferrous sulfate 325 (65 FE) MG tablet Take 1 tablet (325 mg total) by mouth 2 (two) times daily with a meal. 60 tablet 1  . furosemide (LASIX) 80 MG tablet Take 80 mg by mouth daily as needed for fluid.    Marland Kitchen gabapentin (NEURONTIN) 100 MG capsule Take 100 mg by mouth at bedtime.     . hydrochlorothiazide (HYDRODIURIL) 12.5 MG tablet Take 1 tablet by mouth daily.   3  . HYDROcodone-acetaminophen (NORCO) 7.5-325 MG tablet Take 1-2 tablets by mouth every 4 (four) hours as needed for severe pain (pain score 7-10). 40 tablet 0  . latanoprost (XALATAN) 0.005 % ophthalmic solution Place 1 drop into both eyes every morning.    . linaclotide (LINZESS) 290 MCG CAPS capsule Take 290 mcg by mouth daily before breakfast.    . losartan (COZAAR) 100 MG tablet Take 100 mg by mouth daily.  11  . metformin (FORTAMET) 1000 MG (OSM) 24 hr tablet Take 1,000 mg by mouth every morning.  10  . methocarbamol (ROBAXIN) 500 MG tablet Take 1 tablet (500 mg total) by mouth every 6 (six) hours  as needed for muscle spasms. 40 tablet 1  . metoprolol succinate (TOPROL-XL) 50 MG 24 hr tablet Take 1 tablet (50 mg total) by mouth daily.    . montelukast (SINGULAIR) 10 MG tablet Take 10 mg by mouth every morning.    . Multiple Vitamins-Minerals (MULTIVITAMIN WITH MINERALS) tablet Take 1 tablet by mouth daily.    . ondansetron (ZOFRAN-ODT) 4 MG disintegrating tablet Take 1 tablet by mouth every 4 (four) hours as needed for nausea or vomiting.     Marland Kitchen oxybutynin (DITROPAN) 5 MG tablet Take 5 mg by mouth daily.  1  . OZEMPIC, 0.25 OR 0.5 MG/DOSE, 2 MG/1.5ML SOPN Inject 50 Units into the muscle once a week. Wednesday    . pantoprazole (PROTONIX) 40 MG tablet Take 40 mg by mouth daily.    . potassium chloride SA (K-DUR,KLOR-CON) 20 MEQ tablet Take 20 mEq by mouth 2 (two) times daily. *DISSOLVABLE    . senna-docusate (SENOKOT-S) 8.6-50 MG tablet Take 1 tablet by mouth 2 (two) times daily.    Tyler Aas FLEXTOUCH 200 UNIT/ML SOPN Inject 25 Units into the muscle at bedtime.   10  . Vitamin D, Ergocalciferol, (DRISDOL) 50000 units CAPS capsule Take 50,000 Units by mouth every 7 (seven) days. Tuesday    . phentermine (ADIPEX-P) 37.5 MG tablet Take 1 tablet by mouth daily before breakfast.   2   No current facility-administered medications for this visit.      PHYSICAL EXAMINATION: ECOG PERFORMANCE  STATUS: 2 - Symptomatic, <50% confined to bed Vitals:   08/03/18 1523  BP: 126/81  Pulse: 89  Resp: 18  Temp: (!) 96.9 F (36.1 C)   Filed Weights   08/03/18 1523  Weight: 212 lb (96.2 kg)    Physical Exam  Constitutional: She is oriented to person, place, and time. No distress.  HENT:  Head: Normocephalic and atraumatic.  Mouth/Throat: Oropharynx is clear and moist.  Eyes: Pupils are equal, round, and reactive to light. EOM are normal. No scleral icterus.  Neck: Normal range of motion. Neck supple.  Cardiovascular: Normal rate, regular rhythm and normal heart sounds.  Pulmonary/Chest: Effort normal. No respiratory distress. She has no wheezes.  Abdominal: Soft. Bowel sounds are normal. She exhibits no distension. There is no tenderness.  Musculoskeletal: Normal range of motion. She exhibits edema. She exhibits no deformity.  Bilateral lower extremity edema Right knee status post total knee replacement.  Mild tenderness.    Neurological: She is alert and oriented to person, place, and time. No cranial nerve deficit. Coordination normal.  Skin: Skin is warm.  Psychiatric: Her behavior is normal.     LABORATORY DATA:  I have reviewed the data as listed Lab Results  Component Value Date   WBC 6.8 08/03/2018   HGB 10.5 (L) 08/03/2018   HCT 33.1 (L) 08/03/2018   MCV 96.5 08/03/2018   PLT 439 (H) 08/03/2018   Recent Labs    07/03/18 1204 07/14/18 0322 07/15/18 0300 07/16/18 0251 08/03/18 1602  NA 136 134* 133* 130*  --   K 3.8 3.8 4.1 3.9  --   CL 101 99 103 101  --   CO2 25 24 25 24   --   GLUCOSE 114* 227* 167* 170*  --   BUN 17 12 10 10   --   CREATININE 0.72 0.68 0.66 0.64  --   CALCIUM 8.7* 8.7* 8.4* 7.9*  --   GFRNONAA >60 >60 >60 >60  --   GFRAA >60 >60 >60 >60  --  PROT 5.9*  --   --   --  7.4  ALBUMIN 3.3*  --   --   --  3.6  AST 25  --   --   --  21  ALT 26  --   --   --  22  ALKPHOS 70  --   --   --  98  BILITOT 0.6  --   --   --  0.5  BILIDIR  --    --   --   --  <0.1  IBILI  --   --   --   --  NOT CALCULATED   Iron/TIBC/Ferritin/ %Sat    Component Value Date/Time   IRON 15 (L) 07/17/2018 0108   TIBC 186 (L) 07/17/2018 0108   FERRITIN 204 07/17/2018 0108   IRONPCTSAT 8 (L) 07/17/2018 0108        ASSESSMENT & PLAN:  1. Anemia, unspecified type    I discussed with patient that anemia can be secondary to moving different conditions including nutrition deficiency check iron deficiency, chronic inflammation/disease, hypo-or hyperthyroidism, hemolysis, autoimmune problems, or even underlying bone marrow disorders. Her iron panel was not impressive for severe iron deficiency. Recommend to check smear, CBC, liver function, haptoglobin, LDH, multiple myeloma panel, reticulocyte panel and copper, TSH.   Orders Placed This Encounter  Procedures  . CBC with Differential/Platelet    Standing Status:   Future    Number of Occurrences:   1    Standing Expiration Date:   08/04/2019  . Technologist smear review    Standing Status:   Future    Number of Occurrences:   1    Standing Expiration Date:   08/04/2019  . Hepatic function panel    Standing Status:   Future    Number of Occurrences:   1    Standing Expiration Date:   08/03/2019  . Haptoglobin    Standing Status:   Future    Number of Occurrences:   1    Standing Expiration Date:   08/04/2019  . Lactate dehydrogenase    Standing Status:   Future    Number of Occurrences:   1    Standing Expiration Date:   08/04/2019  . Multiple Myeloma Panel (SPEP&IFE w/QIG)    Standing Status:   Future    Number of Occurrences:   1    Standing Expiration Date:   08/03/2019  . Retic Panel    Standing Status:   Future    Number of Occurrences:   1    Standing Expiration Date:   08/04/2019  . Copper, serum    Standing Status:   Future    Number of Occurrences:   1    Standing Expiration Date:   08/04/2019  . TSH    Standing Status:   Future    Number of Occurrences:   1    Standing  Expiration Date:   08/04/2019    All questions were answered. The patient knows to call the clinic with any problems questions or concerns.  Return of visit: 2 weeks Thank you for this kind referral and the opportunity to participate in the care of this patient. A copy of today's note is routed to referring provider  Total face to face encounter time for this patient visit was 45 min. >50% of the time was  spent in counseling and coordination of care.    Earlie Server, MD, PhD Hematology Oncology [facility] at  [place] Pager- [phone]

## 2018-08-05 DIAGNOSIS — D649 Anemia, unspecified: Secondary | ICD-10-CM | POA: Insufficient documentation

## 2018-08-05 LAB — COPPER, SERUM: COPPER: 150 ug/dL (ref 72–166)

## 2018-08-06 LAB — MULTIPLE MYELOMA PANEL, SERUM
ALBUMIN SERPL ELPH-MCNC: 3.3 g/dL (ref 2.9–4.4)
ALPHA 1: 0.3 g/dL (ref 0.0–0.4)
Albumin/Glob SerPl: 1.1 (ref 0.7–1.7)
Alpha2 Glob SerPl Elph-Mcnc: 0.8 g/dL (ref 0.4–1.0)
B-Globulin SerPl Elph-Mcnc: 1.1 g/dL (ref 0.7–1.3)
GAMMA GLOB SERPL ELPH-MCNC: 0.8 g/dL (ref 0.4–1.8)
GLOBULIN, TOTAL: 3.1 g/dL (ref 2.2–3.9)
IGA: 333 mg/dL (ref 87–352)
IGM (IMMUNOGLOBULIN M), SRM: 26 mg/dL (ref 26–217)
IgG (Immunoglobin G), Serum: 948 mg/dL (ref 700–1600)
TOTAL PROTEIN ELP: 6.4 g/dL (ref 6.0–8.5)

## 2018-08-07 ENCOUNTER — Telehealth (INDEPENDENT_AMBULATORY_CARE_PROVIDER_SITE_OTHER): Payer: Self-pay | Admitting: Specialist

## 2018-08-07 NOTE — Telephone Encounter (Signed)
Alexandra Tran  [phone]    Verbal order  3 times a week for two weeks    Patient is complaining of pain and lom after knee replacement

## 2018-08-07 NOTE — Telephone Encounter (Signed)
I called and gave verbal auth for orders.

## 2018-08-10 ENCOUNTER — Telehealth (INDEPENDENT_AMBULATORY_CARE_PROVIDER_SITE_OTHER): Payer: Self-pay | Admitting: Specialist

## 2018-08-10 NOTE — Telephone Encounter (Signed)
I called and gave verbal ok for orders

## 2018-08-10 NOTE — Telephone Encounter (Signed)
Raquel Sarna, from [facility] called to request VO for [facility] OT for 2x a week for 2 weeks.  CB#[phone].  Thank you.

## 2018-08-11 ENCOUNTER — Telehealth (INDEPENDENT_AMBULATORY_CARE_PROVIDER_SITE_OTHER): Payer: Self-pay | Admitting: Specialist

## 2018-08-11 NOTE — Telephone Encounter (Signed)
Kassie with [facility] called needing verbal orders for skilled nursing 1 wk 9. The number to contact Laurette Schimke is [phone]

## 2018-08-11 NOTE — Telephone Encounter (Signed)
I tried to call but VM has not been set up

## 2018-08-12 NOTE — Telephone Encounter (Signed)
I tried to call again and VM no set up

## 2018-08-13 NOTE — Telephone Encounter (Signed)
I called and was able to speak with Alexandra Tran, I did give verbal ok for these orders

## 2018-08-13 NOTE — Telephone Encounter (Signed)
Alexandra Tran with [facility] called back, she did not realize that her vm was not set up, she now has that set up and needs to confirm the orders for [facility].  CB#[phone].  Thank you.

## 2018-08-17 ENCOUNTER — Inpatient Hospital Stay: Payer: No Typology Code available for payment source | Admitting: Oncology

## 2018-08-17 ENCOUNTER — Inpatient Hospital Stay: Payer: No Typology Code available for payment source

## 2018-08-19 ENCOUNTER — Telehealth (INDEPENDENT_AMBULATORY_CARE_PROVIDER_SITE_OTHER): Payer: Self-pay | Admitting: Specialist

## 2018-08-19 NOTE — Telephone Encounter (Signed)
Patient called advised the fax number to fax papers back is [place]. The number to contact patient is [phone]

## 2018-08-19 NOTE — Telephone Encounter (Signed)
She is going fax [place] papers to fill out.

## 2018-08-19 NOTE — Telephone Encounter (Signed)
I called and advised that it looks like Dr. Lorin Mercy discharged her and so Dr. Lorin Mercy would have signed orders for her to fo to SNF.

## 2018-08-19 NOTE — Telephone Encounter (Signed)
Patient stated supplemental insurance stated they need to know who signed rehabilitation orders for patient ot go to rehab.  Please call patient [phone]

## 2018-08-20 ENCOUNTER — Telehealth (INDEPENDENT_AMBULATORY_CARE_PROVIDER_SITE_OTHER): Payer: Self-pay | Admitting: Specialist

## 2018-08-20 NOTE — Telephone Encounter (Signed)
Patient called asked if the fax was received from her medical doctor. (Dr. Dessie Coma office) The number to contact patient is [phone]

## 2018-08-20 NOTE — Telephone Encounter (Signed)
I called and advised that I had NOT rec'd her paperwork.

## 2018-08-21 ENCOUNTER — Telehealth (INDEPENDENT_AMBULATORY_CARE_PROVIDER_SITE_OTHER): Payer: Self-pay

## 2018-08-21 NOTE — Telephone Encounter (Signed)
Patient would like to know if she can have clearance to drive to get her Rx's from the pharmacy.  Cb# is [phone].  Please advise.  Thank you.

## 2018-08-21 NOTE — Telephone Encounter (Signed)
Please advise 

## 2018-08-24 ENCOUNTER — Encounter (INDEPENDENT_AMBULATORY_CARE_PROVIDER_SITE_OTHER): Payer: Self-pay | Admitting: Radiology

## 2018-08-24 ENCOUNTER — Telehealth (INDEPENDENT_AMBULATORY_CARE_PROVIDER_SITE_OTHER): Payer: Self-pay | Admitting: Specialist

## 2018-08-24 NOTE — Telephone Encounter (Signed)
I called and advised her that I did rec'e the papers and I have typed a note that is her chart and that I had faxed it to [facility] for her.

## 2018-08-24 NOTE — Telephone Encounter (Signed)
Patient left a message wanting to know if you have receive the fax from Dr. Liliana Cline office (I am not sure if that was the name of the doctor, it was hard to understand what name she stated on the voicemail.) CB:[phone].  Thank you.

## 2018-08-26 ENCOUNTER — Telehealth (INDEPENDENT_AMBULATORY_CARE_PROVIDER_SITE_OTHER): Payer: Self-pay | Admitting: Specialist

## 2018-08-26 NOTE — Telephone Encounter (Signed)
I called and gave verbal to Verdis Frederickson at [facility].

## 2018-08-26 NOTE — Telephone Encounter (Signed)
There was a message left on the voicemail (No Name or Facility provided) requesting VO for [facility] PT for the following:  2x a week for 3 weeks.  CB#[phone].  Thank you.

## 2018-09-01 ENCOUNTER — Telehealth (INDEPENDENT_AMBULATORY_CARE_PROVIDER_SITE_OTHER): Payer: Self-pay | Admitting: Specialist

## 2018-09-01 ENCOUNTER — Telehealth (INDEPENDENT_AMBULATORY_CARE_PROVIDER_SITE_OTHER): Payer: Self-pay

## 2018-09-01 NOTE — Telephone Encounter (Signed)
Patient called asked if she can use the Voltaren cream on her neck and shoulder

## 2018-09-01 NOTE — Telephone Encounter (Signed)
Patient called asked if she can use the Voltaren cream on her neck and shoulder. The number to contact patient is [phone]

## 2018-09-01 NOTE — Telephone Encounter (Signed)
Alexandra Tran with [facility] called concerning a PT order that needs to be signed and faxed by Dr. Louanne Skye.  Fax# is [phone].  CB# is [phone].  Please advise.  Thank you.

## 2018-09-01 NOTE — Telephone Encounter (Signed)
Yes Alexandra Tran can use the voltaren gel on the neck and shoulder area, I recommend 4 gm up to 3-4 times per day to each area. Probably should limit the number of areas to 4.

## 2018-09-02 ENCOUNTER — Telehealth (INDEPENDENT_AMBULATORY_CARE_PROVIDER_SITE_OTHER): Payer: Self-pay | Admitting: Specialist

## 2018-09-02 NOTE — Telephone Encounter (Signed)
Ok. I have out her on the schedule

## 2018-09-02 NOTE — Telephone Encounter (Signed)
I called and advised her of this

## 2018-09-02 NOTE — Telephone Encounter (Signed)
Patient called left voicemail message she will keep the appointment for Monday 09/03/18. She said to cancel the appointment for 09/17/18. The number to contact patient is [phone]

## 2018-09-03 ENCOUNTER — Encounter: Payer: Self-pay | Admitting: Oncology

## 2018-09-03 ENCOUNTER — Inpatient Hospital Stay: Payer: Medicare Other

## 2018-09-03 ENCOUNTER — Inpatient Hospital Stay: Payer: Medicare Other | Attending: Oncology | Admitting: Oncology

## 2018-09-03 ENCOUNTER — Other Ambulatory Visit: Payer: Self-pay

## 2018-09-03 VITALS — BP 131/88 | HR 108 | Temp 97.5°F | Resp 18 | Wt 209.7 lb

## 2018-09-03 DIAGNOSIS — D649 Anemia, unspecified: Secondary | ICD-10-CM

## 2018-09-03 DIAGNOSIS — Z7982 Long term (current) use of aspirin: Secondary | ICD-10-CM

## 2018-09-03 DIAGNOSIS — I10 Essential (primary) hypertension: Secondary | ICD-10-CM

## 2018-09-03 DIAGNOSIS — Z79899 Other long term (current) drug therapy: Secondary | ICD-10-CM | POA: Insufficient documentation

## 2018-09-03 DIAGNOSIS — Z9884 Bariatric surgery status: Secondary | ICD-10-CM | POA: Insufficient documentation

## 2018-09-03 DIAGNOSIS — Z90710 Acquired absence of both cervix and uterus: Secondary | ICD-10-CM | POA: Insufficient documentation

## 2018-09-03 DIAGNOSIS — Z96651 Presence of right artificial knee joint: Secondary | ICD-10-CM | POA: Insufficient documentation

## 2018-09-03 DIAGNOSIS — Z7984 Long term (current) use of oral hypoglycemic drugs: Secondary | ICD-10-CM | POA: Diagnosis not present

## 2018-09-03 DIAGNOSIS — E119 Type 2 diabetes mellitus without complications: Secondary | ICD-10-CM | POA: Diagnosis not present

## 2018-09-03 LAB — CBC WITH DIFFERENTIAL/PLATELET
Abs Immature Granulocytes: 0.02 10*3/uL (ref 0.00–0.07)
Basophils Absolute: 0 10*3/uL (ref 0.0–0.1)
Basophils Relative: 1 %
Eosinophils Absolute: 0.2 10*3/uL (ref 0.0–0.5)
Eosinophils Relative: 3 %
HCT: 36 % (ref 36.0–46.0)
Hemoglobin: 11.7 g/dL — ABNORMAL LOW (ref 12.0–15.0)
Immature Granulocytes: 0 %
LYMPHS PCT: 16 %
Lymphs Abs: 0.7 10*3/uL (ref 0.7–4.0)
MCH: 30.2 pg (ref 26.0–34.0)
MCHC: 32.5 g/dL (ref 30.0–36.0)
MCV: 93 fL (ref 80.0–100.0)
Monocytes Absolute: 0.4 10*3/uL (ref 0.1–1.0)
Monocytes Relative: 8 %
Neutro Abs: 3.4 10*3/uL (ref 1.7–7.7)
Neutrophils Relative %: 72 %
Platelets: 257 10*3/uL (ref 150–400)
RBC: 3.87 MIL/uL (ref 3.87–5.11)
RDW: 13 % (ref 11.5–15.5)
WBC: 4.7 10*3/uL (ref 4.0–10.5)
nRBC: 0 % (ref 0.0–0.2)

## 2018-09-03 LAB — IRON AND TIBC
IRON: 53 ug/dL (ref 28–170)
Saturation Ratios: 20 % (ref 10.4–31.8)
TIBC: 261 ug/dL (ref 250–450)
UIBC: 208 ug/dL

## 2018-09-03 LAB — FERRITIN: Ferritin: 251 ng/mL (ref 11–307)

## 2018-09-03 NOTE — Progress Notes (Signed)
Hematology/Oncology follow up note [facility] Telephone:([phone]) [phone] [phone]) [phone]   Patient Care Team: Caprice Renshaw, MD as PCP - General (Internal Medicine)  REFERRING PROVIDER: [facility] nursing home.  CHIEF COMPLAINTS/REASON FOR VISIT:  Evaluation of anemia  HISTORY OF PRESENTING ILLNESS:  Alexandra Tran is a  72 y.o.  female with PMH listed below who was referred to me for evaluation of anemia. Patient was recently status post right total knee replacement.  Found to have anemia. 07/14/2018, CBC showed hemoglobin 10.8, hemoglobin gradually declines over her hospitalization to 8.7. Iron panel 07/17/2018 showed saturation ratio 8, TIBC 186, ferritin 200 04.  Folate level 16, vitamin B12 3923 She was given IV iron and was referred to heme-onc for further evaluation. Patient tells me that her impression is she is coming today to get more IV iron.  After being told by our nurse that assessment first today and will not get IV iron today, she feels upset.  She says" I am not happy and I am not getting any today".  They told me I am getting iron today"."  My daughter is a Designer, jewellery".   She reports that her iron has always been low had got IV iron infusion in the past.  She has a history gastric bypass.  Reports feeling tired, fatigued.  She denies shortness of breath with exertion chronic baseline.  Report right knee still feels tender Currently she lives in [place] home.    INTERVAL HISTORY Alexandra Tran is a 72 y.o. female who has above history reviewed by me today presents for follow up visit for management of anemia.  During the interval she has had lab work up done and presents to discuss about results.  She reports feeling tired.    Review of Systems  Constitutional: Positive for malaise/fatigue. Negative for chills, fever and weight loss.  HENT: Negative for nosebleeds and sore throat.   Eyes: Negative  for double vision, photophobia and redness.  Respiratory: Negative for cough, hemoptysis, shortness of breath and wheezing.   Cardiovascular: Negative for chest pain, palpitations, orthopnea and leg swelling.  Gastrointestinal: Positive for constipation. Negative for abdominal pain, blood in stool, nausea and vomiting.  Genitourinary: Negative for dysuria.  Musculoskeletal: Negative for back pain, myalgias and neck pain.       Right knee pain  Skin: Negative for itching and rash.  Neurological: Positive for weakness. Negative for dizziness, tingling, tremors and focal weakness.  Endo/Heme/Allergies: Negative for environmental allergies. Does not bruise/bleed easily.  Psychiatric/Behavioral: Negative for hallucinations.    MEDICAL HISTORY:  Past Medical History:  Diagnosis Date  . Complication of anesthesia   . Diabetes mellitus without complication ([place])   . DJD (degenerative joint disease)   . Hypertension   . PONV (postoperative nausea and vomiting)   . PVD (peripheral vascular disease) ([place])     SURGICAL HISTORY: Past Surgical History:  Procedure Laterality Date  . ABDOMINAL HYSTERECTOMY    . BREAST EXCISIONAL BIOPSY Right    negative  . CATARACT EXTRACTION, BILATERAL    . EYE SURGERY    . GASTRIC BYPASS  2001  . TOTAL KNEE ARTHROPLASTY Right 07/13/2018  . TOTAL KNEE ARTHROPLASTY Right 07/13/2018   Procedure: RIGHT TOTAL KNEE ARTHROPLASTY;  Surgeon: Jessy Oto, MD;  Location: [facility];  Service: Orthopedics;  Laterality: Right;  Needs RNFA    SOCIAL HISTORY: Social History   Socioeconomic History  . Marital status: Widowed  Spouse name: Not on file  . Number of children: Not on file  . Years of education: Not on file  . Highest education level: Not on file  Occupational History  . Occupation: retired  Scientific laboratory technician  . Financial resource strain: Not on file  . Food insecurity:    Worry: Not on file    Inability: Not on file  . Transportation needs:     Medical: Not on file    Non-medical: Not on file  Tobacco Use  . Smoking status: Never Smoker  . Smokeless tobacco: Never Used  Substance and Sexual Activity  . Alcohol use: No  . Drug use: No    Comment: uses CBD oil on her knee  . Sexual activity: Not on file  Lifestyle  . Physical activity:    Days per week: Not on file    Minutes per session: Not on file  . Stress: Not on file  Relationships  . Social connections:    Talks on phone: Not on file    Gets together: Not on file    Attends religious service: Not on file    Active member of club or organization: Not on file    Attends meetings of clubs or organizations: Not on file    Relationship status: Not on file  . Intimate partner violence:    Fear of current or ex partner: Not on file    Emotionally abused: Not on file    Physically abused: Not on file    Forced sexual activity: Not on file  Other Topics Concern  . Not on file  Social History Narrative  . Not on file    FAMILY HISTORY: Family History  Problem Relation Age of Onset  . Diabetes Mother   . Prostate cancer Father   . Heart disease Brother   . Breast cancer Daughter     ALLERGIES:  is allergic to hydrocodone-acetaminophen and nsaids.  MEDICATIONS:  Current Outpatient Medications  Medication Sig Dispense Refill  . albuterol (PROVENTIL HFA;VENTOLIN HFA) 108 (90 Base) MCG/ACT inhaler Inhale 1-2 puffs into the lungs every 4 (four) hours as needed for wheezing or shortness of breath.     Marland Kitchen aspirin 81 MG EC tablet Take 1 tablet (81 mg total) by mouth 2 (two) times daily after a meal. 30 tablet 1  . cilostazol (PLETAL) 100 MG tablet Take 100 mg by mouth 2 (two) times daily.     . diclofenac sodium (VOLTAREN) 1 % GEL Apply 1 application topically 3 (three) times daily as needed (pain).    . dorzolamide-timolol (COSOPT) 22.3-6.8 MG/ML ophthalmic solution Place 1 drop into both eyes 2 (two) times daily.    . furosemide (LASIX) 80 MG tablet Take 80 mg by  mouth daily as needed for fluid.    Marland Kitchen gabapentin (NEURONTIN) 100 MG capsule Take 100 mg by mouth at bedtime.     . hydrochlorothiazide (HYDRODIURIL) 12.5 MG tablet Take 1 tablet by mouth daily.  3  . HYDROcodone-acetaminophen (NORCO) 7.5-325 MG tablet Take 1-2 tablets by mouth every 4 (four) hours as needed for severe pain (pain score 7-10). 40 tablet 0  . latanoprost (XALATAN) 0.005 % ophthalmic solution Place 1 drop into both eyes every morning.    . linaclotide (LINZESS) 290 MCG CAPS capsule Take 290 mcg by mouth daily before breakfast.    . losartan (COZAAR) 100 MG tablet Take 100 mg by mouth daily.  11  . metformin (FORTAMET) 1000 MG (OSM) 24 hr tablet  Take 1,000 mg by mouth every morning.  10  . methocarbamol (ROBAXIN) 500 MG tablet Take 1 tablet (500 mg total) by mouth every 6 (six) hours as needed for muscle spasms. 40 tablet 1  . metoprolol succinate (TOPROL-XL) 50 MG 24 hr tablet Take 1 tablet (50 mg total) by mouth daily.    . montelukast (SINGULAIR) 10 MG tablet Take 10 mg by mouth every morning.    . Multiple Vitamins-Minerals (MULTIVITAMIN WITH MINERALS) tablet Take 1 tablet by mouth daily.    . ondansetron (ZOFRAN-ODT) 4 MG disintegrating tablet Take 1 tablet by mouth every 4 (four) hours as needed for nausea or vomiting.     Marland Kitchen oxybutynin (DITROPAN) 5 MG tablet Take 5 mg by mouth daily.  1  . OZEMPIC, 0.25 OR 0.5 MG/DOSE, 2 MG/1.5ML SOPN Inject 50 Units into the muscle once a week. Wednesday    . pantoprazole (PROTONIX) 40 MG tablet Take 40 mg by mouth daily.    . potassium chloride SA (K-DUR,KLOR-CON) 20 MEQ tablet Take 20 mEq by mouth 2 (two) times daily. *DISSOLVABLE    . senna-docusate (SENOKOT-S) 8.6-50 MG tablet Take 1 tablet by mouth 2 (two) times daily.    Tyler Aas FLEXTOUCH 200 UNIT/ML SOPN Inject 25 Units into the muscle at bedtime.   10  . Vitamin D, Ergocalciferol, (DRISDOL) 50000 units CAPS capsule Take 50,000 Units by mouth every 7 (seven) days. Tuesday    .  phentermine (ADIPEX-P) 37.5 MG tablet Take 1 tablet by mouth daily before breakfast.   2   No current facility-administered medications for this visit.      PHYSICAL EXAMINATION: ECOG PERFORMANCE STATUS: 2 - Symptomatic, <50% confined to bed Vitals:   09/03/18 1058  BP: 131/88  Pulse: (!) 108  Resp: 18  Temp: (!) 97.5 F (36.4 C)   Filed Weights   09/03/18 1058  Weight: 209 lb 10.5 oz (95.1 kg)    Physical Exam  Constitutional: She is oriented to person, place, and time. No distress.  HENT:  Head: Normocephalic and atraumatic.  Mouth/Throat: Oropharynx is clear and moist.  Eyes: Pupils are equal, round, and reactive to light. EOM are normal. No scleral icterus.  Neck: Normal range of motion. Neck supple.  Cardiovascular: Normal rate, regular rhythm and normal heart sounds.  Pulmonary/Chest: Effort normal. No respiratory distress. She has no wheezes.  Abdominal: Soft. Bowel sounds are normal. She exhibits no distension and no mass. There is no tenderness.  Musculoskeletal: Normal range of motion. She exhibits edema. She exhibits no deformity.  Bilateral lower extremity edema Right knee status post total knee replacement.  Mild tenderness.    Neurological: She is alert and oriented to person, place, and time. No cranial nerve deficit. Coordination normal.  Skin: Skin is warm and dry. No rash noted. No erythema.  Psychiatric: She has a normal mood and affect. Her behavior is normal. Thought content normal.     LABORATORY DATA:  I have reviewed the data as listed Lab Results  Component Value Date   WBC 4.7 09/03/2018   HGB 11.7 (L) 09/03/2018   HCT 36.0 09/03/2018   MCV 93.0 09/03/2018   PLT 257 09/03/2018   Recent Labs    07/03/18 1204 07/14/18 0322 07/15/18 0300 07/16/18 0251 08/03/18 1602  NA 136 134* 133* 130*  --   K 3.8 3.8 4.1 3.9  --   CL 101 99 103 101  --   CO2 25 24 25 24   --   GLUCOSE  114* 227* 167* 170*  --   BUN 17 12 10 10   --   CREATININE  0.72 0.68 0.66 0.64  --   CALCIUM 8.7* 8.7* 8.4* 7.9*  --   GFRNONAA >60 >60 >60 >60  --   GFRAA >60 >60 >60 >60  --   PROT 5.9*  --   --   --  7.4  ALBUMIN 3.3*  --   --   --  3.6  AST 25  --   --   --  21  ALT 26  --   --   --  22  ALKPHOS 70  --   --   --  98  BILITOT 0.6  --   --   --  0.5  BILIDIR  --   --   --   --  <0.1  IBILI  --   --   --   --  NOT CALCULATED   Iron/TIBC/Ferritin/ %Sat    Component Value Date/Time   IRON 53 09/03/2018 1120   TIBC 261 09/03/2018 1120   FERRITIN 251 09/03/2018 1120   IRONPCTSAT 20 09/03/2018 1120        ASSESSMENT & PLAN:  1. Anemia, unspecified type   2. H/O gastric bypass    Labs were reviewed and discussed with patient in details.  She has my chart on her cell phone and I went through all her labs and explained every results/reading to her in details.   Smear showed no schistocytes. There are spherocytes.  Normal haptoglobin. Normal reticulocyte hemoglobin level, indicating adequate iron store. Normal TSH. Normal copper, Folate, B12 level.  SPEP negative for M spike.   Patient reports feeling fatigue and requests IV iron to be given. I had a lengthy discussion with her that her hemoglobin appears improving and I don't think she needs IV iron. Labs were done about one month ago and I recommend repeat Iron panel and cbc.  She is in agreement.   Repeat CBC and iron panel reviewed. Hemoglobin further improved. Iron panel not consistent with iron deficiency. Hold IV iron.    Orders Placed This Encounter  Procedures  . CBC with Differential/Platelet    Standing Status:   Future    Number of Occurrences:   1    Standing Expiration Date:   09/04/2019  . Iron and TIBC    Standing Status:   Future    Number of Occurrences:   1    Standing Expiration Date:   09/04/2019  . Ferritin    Standing Status:   Future    Number of Occurrences:   1    Standing Expiration Date:   09/04/2019    All questions were answered. The patient knows  to call the clinic with any problems questions or concerns.  Return of visit: 6 months.  Total face to face encounter time for this patient visit was 25 min. >50% of the time was  spent in counseling and coordination of care. Earlie Server, MD, PhD Hematology Oncology [facility] Pager- [phone]

## 2018-09-03 NOTE — Progress Notes (Signed)
Patient here today for follow up and IV Iron.  Patient c/o fatigue.

## 2018-09-07 ENCOUNTER — Ambulatory Visit (INDEPENDENT_AMBULATORY_CARE_PROVIDER_SITE_OTHER): Payer: Medicare Other

## 2018-09-07 ENCOUNTER — Encounter (INDEPENDENT_AMBULATORY_CARE_PROVIDER_SITE_OTHER): Payer: Self-pay | Admitting: Specialist

## 2018-09-07 ENCOUNTER — Ambulatory Visit (INDEPENDENT_AMBULATORY_CARE_PROVIDER_SITE_OTHER): Payer: Medicare Other | Admitting: Specialist

## 2018-09-07 VITALS — BP 124/74 | HR 98 | Ht 64.0 in | Wt 205.0 lb

## 2018-09-07 DIAGNOSIS — Z96651 Presence of right artificial knee joint: Secondary | ICD-10-CM

## 2018-09-07 MED ORDER — ONDANSETRON 4 MG PO TBDP: 4.0000 mg | ORAL_TABLET | Freq: Three times a day (TID) | ORAL | 0 refills | Status: DC | PRN

## 2018-09-07 MED ORDER — HYDROCODONE-ACETAMINOPHEN 5-325 MG PO TABS: 1.0000 | ORAL_TABLET | Freq: Four times a day (QID) | ORAL | 0 refills | Status: DC | PRN

## 2018-09-07 MED ORDER — BACLOFEN 10 MG PO TABS: 10.0000 mg | ORAL_TABLET | Freq: Three times a day (TID) | ORAL | 0 refills | Status: AC

## 2018-09-07 NOTE — Progress Notes (Signed)
Post-Op Visit Note   Patient: Alexandra Tran           Date of Birth: [birth year]           MRN: [id number] Visit Date: 09/07/2018 PCP: Caprice Renshaw, MD   Assessment & Plan:2 months post op right TKR for osteoarthritis right knee.   Chief Complaint:  Chief Complaint  Patient presents with  . Right Knee - Routine Post Op   Visit Diagnoses:  1. S/P total knee replacement, right   10-120 degrees ROM right knee. Incision is healed. Mild swelling there is right lower leg swelling from the knee downwards. Minimal warmth.  SNF for 2 weeks post op and then she was returned home and now is staying with her daughter.  Body mass index is 35.19 kg/m.   Plan: The main ways of treat osteoarthritis, that are found to be success. Weight loss helps to decrease pain. Exercise is important to maintaining cartilage and thickness and strengthening. Home health will evaluate for an aid at home with help with ADLs. Ice is okay  In afternoon and evening and hot shower in the am. Will ask HHN to evaluate for a medical assistant during the day for 1 month.    Follow-Up Instructions: Return in about 4 weeks (around 10/05/2018).   Orders:  Orders Placed This Encounter  Procedures  . XR Knee 1-2 Views Right  . Ambulatory referral to [facility] ordered this encounter  Medications  . baclofen (LIORESAL) 10 MG tablet    Sig: Take 1 tablet (10 mg total) by mouth 3 (three) times daily.    Dispense:  30 each    Refill:  0  . HYDROcodone-acetaminophen (NORCO/VICODIN) 5-325 MG tablet    Sig: Take 1 tablet by mouth every 6 (six) hours as needed.    Dispense:  30 tablet    Refill:  0  . ondansetron (ZOFRAN-ODT) 4 MG disintegrating tablet    Sig: Take 1 tablet (4 mg total) by mouth every 8 (eight) hours as needed for nausea or vomiting.    Dispense:  20 tablet    Refill:  0    Imaging: Xr Knee 1-2 Views Right  Result Date: 09/07/2018 AP and lateral right knee show cemented TKR in  good position and alignment.    PMFS History: Patient Active Problem List   Diagnosis Date Noted  . Unilateral primary osteoarthritis, right knee 07/13/2018    Priority: High    Class: Chronic  . Acute blood loss anemia 07/14/2018    Priority: Medium    Class: Acute  . Peripheral vascular disease of extremity ([place]) 07/13/2018    Priority: Medium    Class: Chronic  . Anemia 08/05/2018  . Tachycardia 07/14/2018  . Diabetes mellitus with hyperglycemia ([place]) 07/14/2018  . Essential hypertension 07/14/2018  . S/P TKR (total knee replacement) using cement, right 07/13/2018   Past Medical History:  Diagnosis Date  . Complication of anesthesia   . Diabetes mellitus without complication ([place])   . DJD (degenerative joint disease)   . Hypertension   . PONV (postoperative nausea and vomiting)   . PVD (peripheral vascular disease) ([facility])     Family History  Problem Relation Age of Onset  . Diabetes Mother   . Prostate cancer Father   . Heart disease Brother   . Breast cancer Daughter     Past Surgical History:  Procedure Laterality Date  . ABDOMINAL HYSTERECTOMY    . BREAST EXCISIONAL  BIOPSY Right    negative  . CATARACT EXTRACTION, BILATERAL    . EYE SURGERY    . GASTRIC BYPASS  2001  . TOTAL KNEE ARTHROPLASTY Right 07/13/2018  . TOTAL KNEE ARTHROPLASTY Right 07/13/2018   Procedure: RIGHT TOTAL KNEE ARTHROPLASTY;  Surgeon: Jessy Oto, MD;  Location: [facility];  Service: Orthopedics;  Laterality: Right;  Needs RNFA   Social History   Occupational History  . Occupation: retired  Tobacco Use  . Smoking status: Never Smoker  . Smokeless tobacco: Never Used  Substance and Sexual Activity  . Alcohol use: No  . Drug use: No    Comment: uses CBD oil on her knee  . Sexual activity: Not on file

## 2018-09-09 ENCOUNTER — Telehealth (INDEPENDENT_AMBULATORY_CARE_PROVIDER_SITE_OTHER): Payer: Self-pay | Admitting: Specialist

## 2018-09-09 NOTE — Telephone Encounter (Signed)
Alexandra Tran from [facility] called left voicemail message stating patient refused Adin from [employer]. Patient preferred another [facility] agency. There will be no start of Home Health from [facility]. The number to contact Dray Dente Tran is [phone]

## 2018-09-09 NOTE — Telephone Encounter (Signed)
Christy Sartorius from [facility] called left voicemail message stating patient refused Koppel from [employer]. Patient preferred another [facility] agency. There will be no start of Home Health from [facility]. The number to contact Christy Sartorius is [phone]

## 2018-09-14 ENCOUNTER — Telehealth (INDEPENDENT_AMBULATORY_CARE_PROVIDER_SITE_OTHER): Payer: Self-pay | Admitting: Radiology

## 2018-09-14 NOTE — Telephone Encounter (Signed)
IC patient and she says that she takes the hydrocodone, and has no problems, is ok, she is not allergic. She says that she is hurting some with PT, cramping. Otherwise she is doing ok.  She also wants to know who to see for the decreased blood flow to the right GT?  Need to see who Dr Louanne Skye recommends.   She also declines a HHRN to help her with meds.

## 2018-09-14 NOTE — Telephone Encounter (Signed)
HH called, asked for 2 x week for 4 weeks PT orders, I approved.  He also asks about patient taking hydrocodone and she has an allergy to hydrocodone listed, with n/v.  Patient not having any symptoms from the meds. I will call patient to discuss, also he says that patient had some expired pain meds and he advised her not to take them.  I will call patient to followup on all this.

## 2018-09-16 NOTE — Telephone Encounter (Signed)
IC patient and she says that she takes the hydrocodone, and has no problems, is ok, she is not allergic. She says that she is hurting some with PT, cramping. Otherwise she is doing ok.  She also wants to know who to see for the decreased blood flow to the right GT?  Need to see who Dr Louanne Skye recommends.   She also declines a HHRN to help her with meds.  --Please advise

## 2018-09-17 ENCOUNTER — Ambulatory Visit (INDEPENDENT_AMBULATORY_CARE_PROVIDER_SITE_OTHER): Payer: Medicare Other | Admitting: Specialist

## 2018-09-21 ENCOUNTER — Telehealth (INDEPENDENT_AMBULATORY_CARE_PROVIDER_SITE_OTHER): Payer: Self-pay | Admitting: Specialist

## 2018-09-21 NOTE — Telephone Encounter (Signed)
Patient called to let Dr. Louanne Skye know that she is in pain every day and wants to know what else she can do.  She stated that she is taking medication, but it is not really helping and seems to be worse than it use to be.  She wants to know what else can be done or does Dr. Louanne Skye have any advice for her.  CB#[phone].  Thank you.

## 2018-09-21 NOTE — Telephone Encounter (Signed)
Patient called to let Dr. Louanne Skye know that she is in pain every day and wants to know what else she can do.  She stated that she is taking medication, but it is not really helping and seems to be worse than it use to be.  She wants to know what else can be done or does Dr. Louanne Skye have any advice for her.

## 2018-09-24 NOTE — Telephone Encounter (Signed)
Can you please call this patient and offer appt for eval with one of the surgeons? Until she can be seen again by Dr Louanne Skye?

## 2018-09-24 NOTE — Telephone Encounter (Signed)
Calling patient to offer an appt to be seen with another provider, for re evaluation, until Dr Louanne Skye can see her again. See other msg.

## 2018-10-06 ENCOUNTER — Telehealth (INDEPENDENT_AMBULATORY_CARE_PROVIDER_SITE_OTHER): Payer: Self-pay | Admitting: Specialist

## 2018-10-06 NOTE — Telephone Encounter (Signed)
Patient called stated that she needs a referral to Dr. Oneida Alar [facility].  Stated Dr.Nitka has referred her before to this MD.  Please call patient to advise.

## 2018-10-06 NOTE — Telephone Encounter (Signed)
Patient left a message inquiring about a referral to Dr. Oneida Alar for evaluation for an artery problem.  CB#[phone].  Thank you.

## 2018-10-07 NOTE — Telephone Encounter (Signed)
I called and spoke with patient, I advised that I did not see a referral in the chart, she states that he referred her 2 yrs ago, I advised that I would have to discuss this with Dr. Louanne Skye and let her know what he says.---Please advise on a referral to Dr. Oneida Alar

## 2018-10-07 NOTE — Telephone Encounter (Signed)
See other message

## 2018-10-09 ENCOUNTER — Telehealth (INDEPENDENT_AMBULATORY_CARE_PROVIDER_SITE_OTHER): Payer: Self-pay | Admitting: Radiology

## 2018-10-09 NOTE — Telephone Encounter (Signed)
I called and lmom giving verbal auth for this

## 2018-10-09 NOTE — Telephone Encounter (Signed)
Christy Sartorius from [facility] [phone]  Approval for continued physical therapy 2 times a week for 2 weeks. Improve ROM, strength, gait and balance

## 2018-10-16 ENCOUNTER — Other Ambulatory Visit (INDEPENDENT_AMBULATORY_CARE_PROVIDER_SITE_OTHER): Payer: Self-pay | Admitting: Specialist

## 2018-10-16 NOTE — Telephone Encounter (Signed)
Patient called requesting a refill on the Zofran and is also needing something for leg cramps.  CB#[phone].  Thank you.

## 2018-10-19 ENCOUNTER — Encounter (INDEPENDENT_AMBULATORY_CARE_PROVIDER_SITE_OTHER): Payer: Self-pay | Admitting: Specialist

## 2018-10-19 ENCOUNTER — Ambulatory Visit (INDEPENDENT_AMBULATORY_CARE_PROVIDER_SITE_OTHER): Payer: Medicare Other | Admitting: Specialist

## 2018-10-19 VITALS — BP 130/76 | HR 101 | Ht 64.0 in | Wt 205.0 lb

## 2018-10-19 DIAGNOSIS — I739 Peripheral vascular disease, unspecified: Secondary | ICD-10-CM

## 2018-10-19 DIAGNOSIS — R2241 Localized swelling, mass and lump, right lower limb: Secondary | ICD-10-CM | POA: Diagnosis not present

## 2018-10-19 DIAGNOSIS — M79671 Pain in right foot: Secondary | ICD-10-CM | POA: Diagnosis not present

## 2018-10-19 MED ORDER — ONDANSETRON 4 MG PO TBDP: 4.0000 mg | ORAL_TABLET | Freq: Three times a day (TID) | ORAL | 0 refills | Status: DC | PRN

## 2018-10-19 MED ORDER — TRAMADOL HCL 50 MG PO TABS: 50.0000 mg | ORAL_TABLET | Freq: Four times a day (QID) | ORAL | 0 refills | Status: AC | PRN

## 2018-10-19 NOTE — Patient Instructions (Signed)
  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 Exercise helps and I am referring you to out patient PT at [facility] with [facility] . Well padded shoes help. Ice the knee 2-3 times a day 15-20 mins at a time. Tramadol for Pain. Due to low blood pressure values in the screening test for peripheral vascular(arterial) disease I recommend a vascular surgery consult.

## 2018-10-19 NOTE — Progress Notes (Signed)
Post-Op Visit Note   Patient: Alexandra Tran           Date of Birth: [birth year]           MRN: [id number] Visit Date: 10/19/2018 PCP: Caprice Renshaw, MD   Assessment & Plan:3 months and 6 days post right TKR Peripheral vascular disease right foot, with great toe resting pain AA ratio 0.38 borderline for circulation without Pain.  Chief Complaint:  Chief Complaint  Patient presents with  . Right Knee - Follow-up  Right Knee 0-95 degrees. Mild swelling right knee. Right foot the second toe with some discoloration, great toe is cool but sensate and skin tugor is intact.  DP is not palpable. PT is not palpable. Visit Diagnoses:  1. Pain in right foot   2. Peripheral vascular disease of extremity ([place])   3. Localized swelling of right lower leg     Plan: Referral to vascular surgeon to assess for PVD and possible resting claudication right leg and great toe. Assess for vasodialator and improve blood flow.  PT to continued due to decreased ROM.  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 Exercise helps and I am referring you to out patient PT at [facility] . Well padded shoes help. Ice the knee 2-3 times a day 15-20 mins at a time. Tramadol for Pain. Due to low blood pressure values in the screening test for peripheral vascular(arterial) disease I recommend a vascular surgery consult. Follow-Up Instructions: No follow-ups on file.   Orders:  No orders of the defined types were placed in this encounter.  No orders of the defined types were placed in this encounter.   Imaging: No results found.  PMFS History: Patient Active Problem List   Diagnosis Date Noted  . Unilateral primary osteoarthritis, right knee 07/13/2018    Priority: High    Class: Chronic  . Acute blood loss anemia 07/14/2018   Priority: Medium    Class: Acute  . Peripheral vascular disease of extremity ([place]) 07/13/2018    Priority: Medium    Class: Chronic  . Anemia 08/05/2018  . Tachycardia 07/14/2018  . Diabetes mellitus with hyperglycemia ([place]) 07/14/2018  . Essential hypertension 07/14/2018  . S/P TKR (total knee replacement) using cement, right 07/13/2018   Past Medical History:  Diagnosis Date  . Complication of anesthesia   . Diabetes mellitus without complication ([place])   . DJD (degenerative joint disease)   . Hypertension   . PONV (postoperative nausea and vomiting)   . PVD (peripheral vascular disease) ([facility])     Family History  Problem Relation Age of Onset  . Diabetes Mother   . Prostate cancer Father   . Heart disease Brother   . Breast cancer Daughter     Past Surgical History:  Procedure Laterality Date  . ABDOMINAL HYSTERECTOMY    . BREAST EXCISIONAL BIOPSY Right    negative  . CATARACT EXTRACTION, BILATERAL    . EYE SURGERY    . GASTRIC BYPASS  2001  . TOTAL KNEE ARTHROPLASTY Right 07/13/2018  . TOTAL KNEE ARTHROPLASTY Right 07/13/2018   Procedure: RIGHT TOTAL KNEE ARTHROPLASTY;  Surgeon: Jessy Oto, MD;  Location: [facility];  Service: Orthopedics;  Laterality: Right;  Needs RNFA   Social History   Occupational History  . Occupation: retired  Tobacco Use  . Smoking  status: Never Smoker  . Smokeless tobacco: Never Used  Substance and Sexual Activity  . Alcohol use: No  . Drug use: No    Comment: uses CBD oil on her knee  . Sexual activity: Not on file

## 2018-10-21 ENCOUNTER — Other Ambulatory Visit (INDEPENDENT_AMBULATORY_CARE_PROVIDER_SITE_OTHER): Payer: Self-pay | Admitting: Specialist

## 2018-10-21 MED ORDER — METHOCARBAMOL 500 MG PO TABS: 500.0000 mg | ORAL_TABLET | Freq: Four times a day (QID) | ORAL | 1 refills | Status: DC | PRN

## 2018-10-21 NOTE — Telephone Encounter (Signed)
Patient called needing a Rx for muscle relaxer called into the pharmacy. The number to contact patient is [phone]

## 2018-10-22 ENCOUNTER — Telehealth (INDEPENDENT_AMBULATORY_CARE_PROVIDER_SITE_OTHER): Payer: Self-pay | Admitting: Specialist

## 2018-10-22 NOTE — Telephone Encounter (Signed)
Patient called asked [facility] [address]. Patient asked if all of her medication can be sent to  This pharmacy? The number to contact patient is [phone]

## 2018-10-22 NOTE — Telephone Encounter (Signed)
This request has been sent to Dr. Nitka 

## 2018-10-23 ENCOUNTER — Ambulatory Visit: Payer: Medicare Other | Attending: Specialist | Admitting: Physical Therapy

## 2018-10-23 ENCOUNTER — Encounter: Payer: Self-pay | Admitting: Physical Therapy

## 2018-10-23 DIAGNOSIS — M25661 Stiffness of right knee, not elsewhere classified: Secondary | ICD-10-CM | POA: Diagnosis present

## 2018-10-23 DIAGNOSIS — M25561 Pain in right knee: Secondary | ICD-10-CM | POA: Insufficient documentation

## 2018-10-23 DIAGNOSIS — M6281 Muscle weakness (generalized): Secondary | ICD-10-CM | POA: Diagnosis present

## 2018-10-23 DIAGNOSIS — R262 Difficulty in walking, not elsewhere classified: Secondary | ICD-10-CM | POA: Diagnosis present

## 2018-10-23 IMAGING — CT CT ANGIO CHEST
3 of 7 series · 18 of 36 positions shown · IV contrast (iopamidol)
Comparison: Chest radiographs 07/03/2018.

CLINICAL DATA: 72-year-old female with recent knee surgery.
Abnormal D-dimer.

EXAM:
CT ANGIOGRAPHY CHEST WITH CONTRAST
TECHNIQUE: Multidetector CT imaging of the chest was performed using the
standard protocol during bolus administration of intravenous
contrast. Multiplanar CT image reconstructions and MIPs were
obtained to evaluate the vascular anatomy.
CONTRAST:  100mL [id number] IOPAMIDOL ([id number]) INJECTION 76%

[Series 6: lung · axial · 0.81mm/px · z∈[+1237,+1339]mm · 3 of 128 slices shown]
[im 26/128  mediastinal]
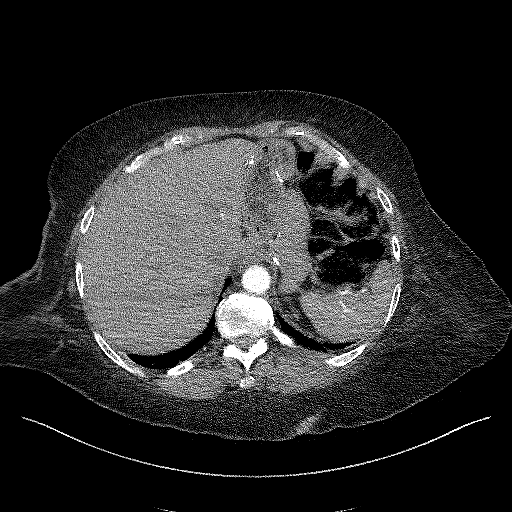
[im 51/128  mediastinal]
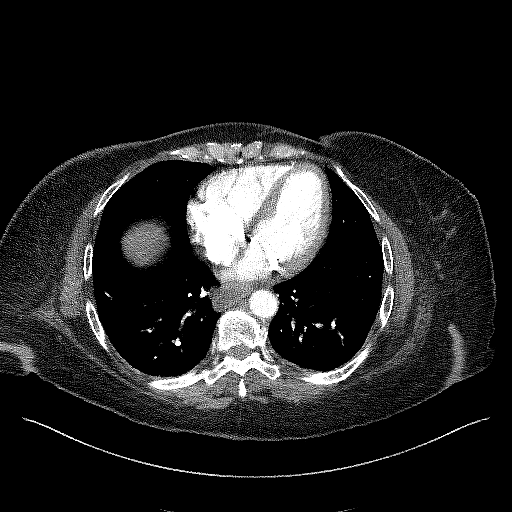
[im 77/128  mediastinal]
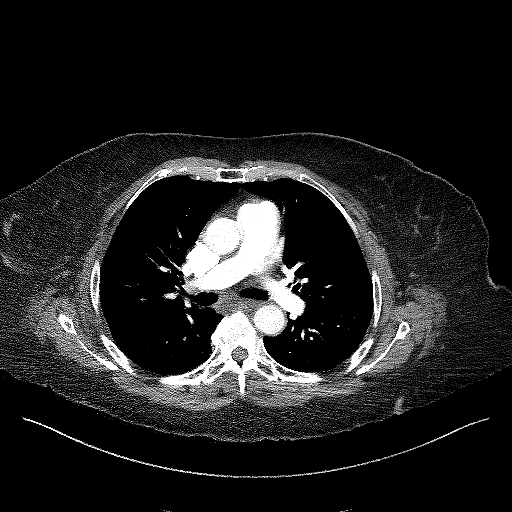

[Series 7: thins · axial · 0.81mm/px · z∈[+1202,+1423]mm · 14 of 366 slices shown]
[im 25/366  lung]
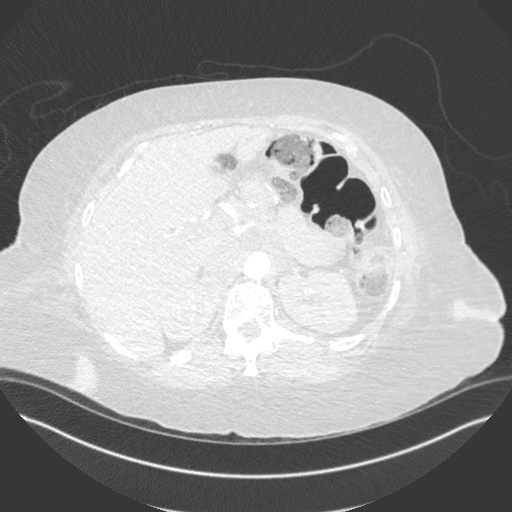
[im 49/366  mediastinal]
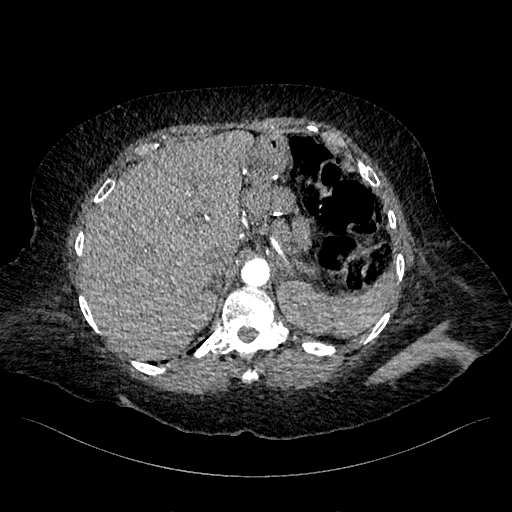
[im 74/366  lung]
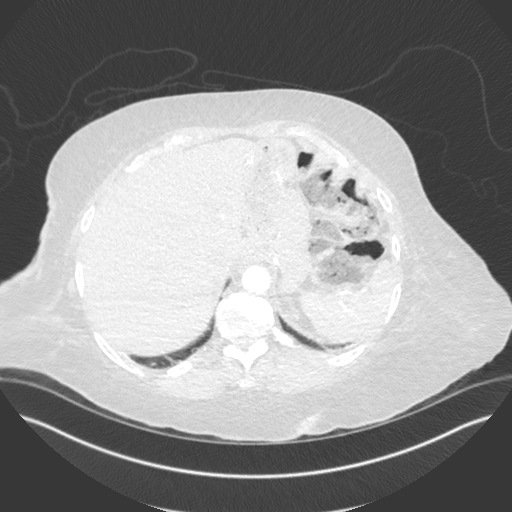
[im 98/366  mediastinal]
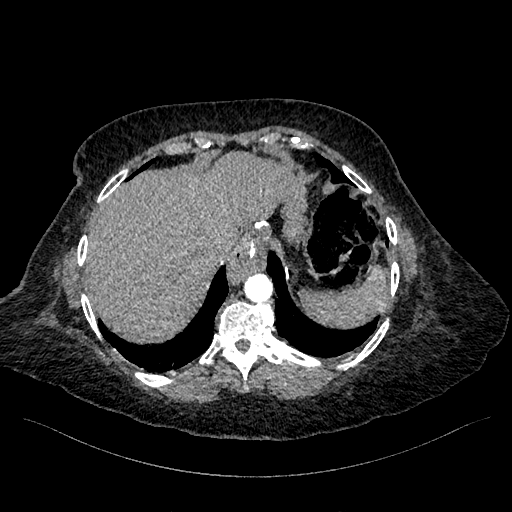
[im 122/366  lung]
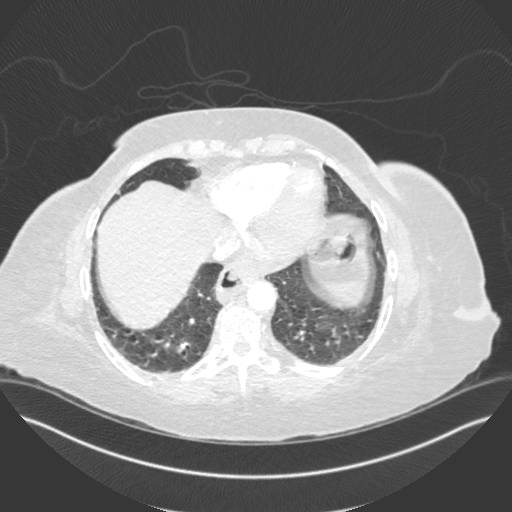
[im 147/366  mediastinal]
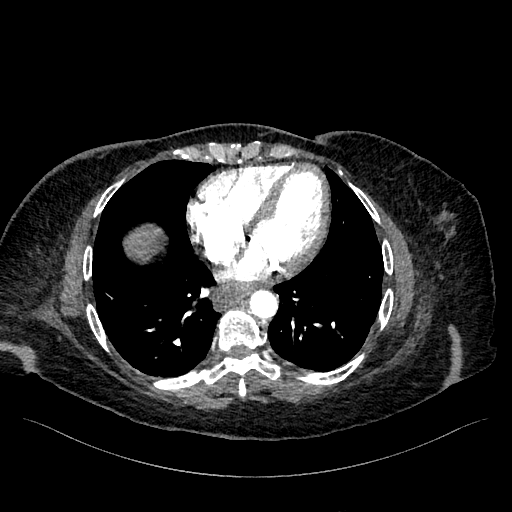
[im 171/366  lung]
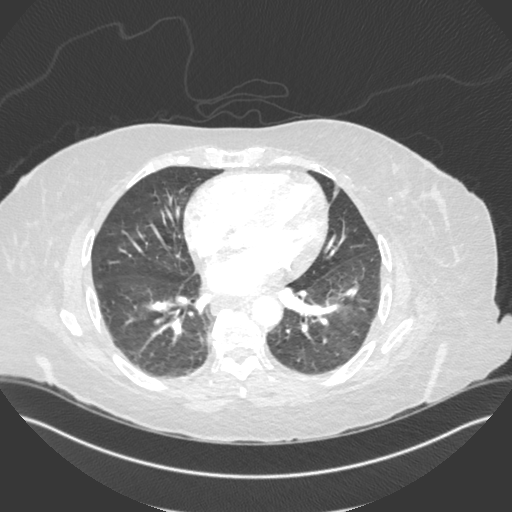
[im 195/366  mediastinal]
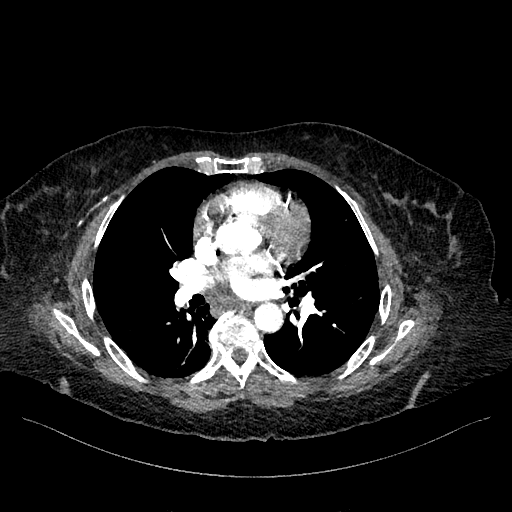
[im 220/366  lung]
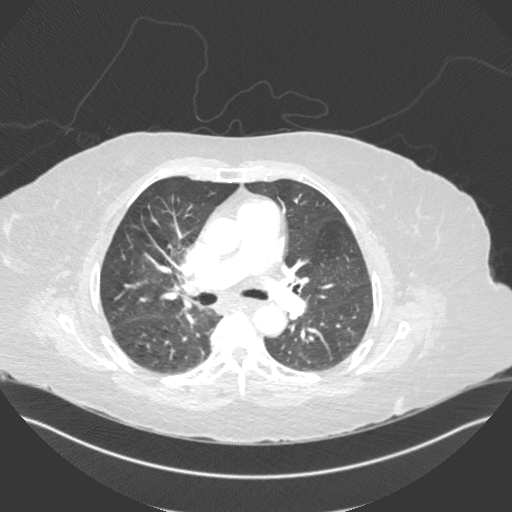
[im 244/366  mediastinal]
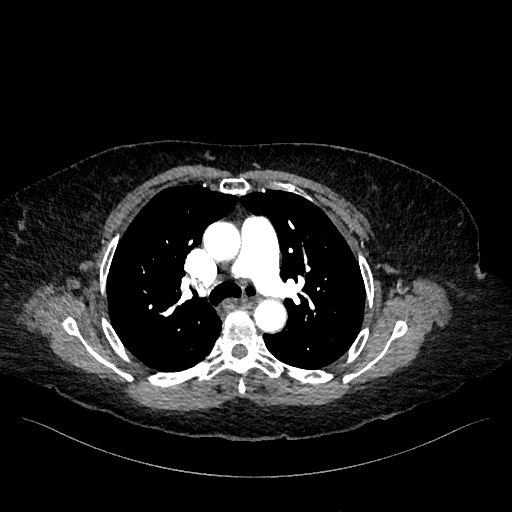
[im 268/366  lung]
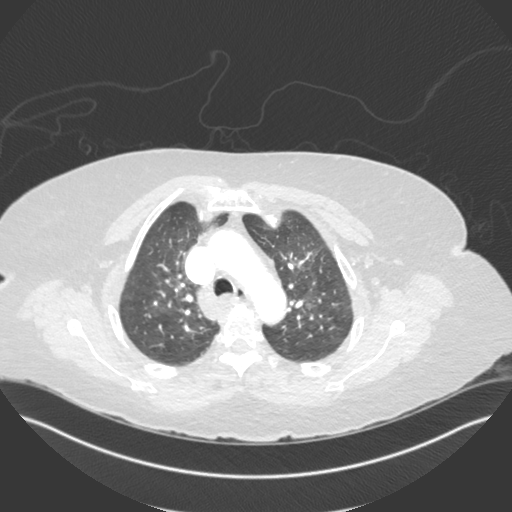
[im 293/366  mediastinal]
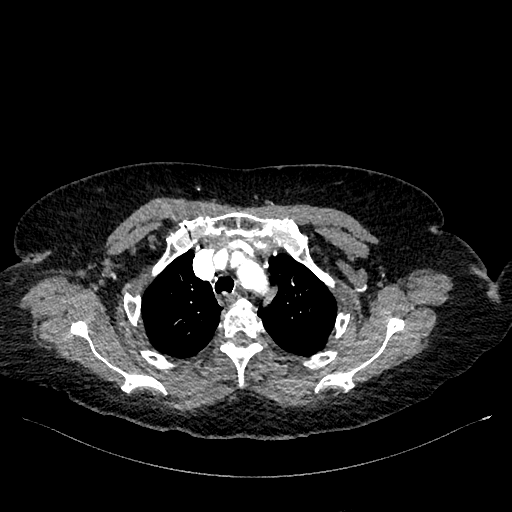
[im 317/366  lung]
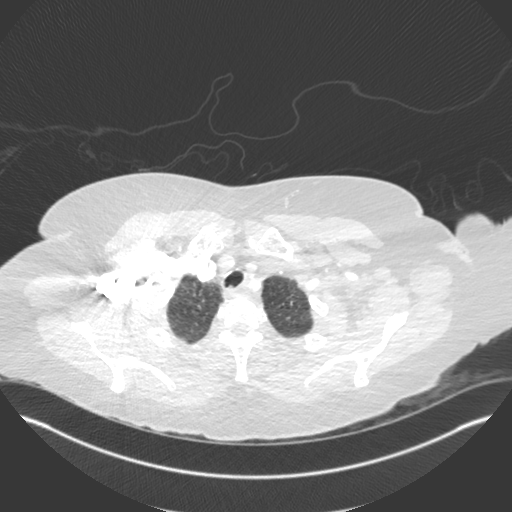
[im 341/366  mediastinal]
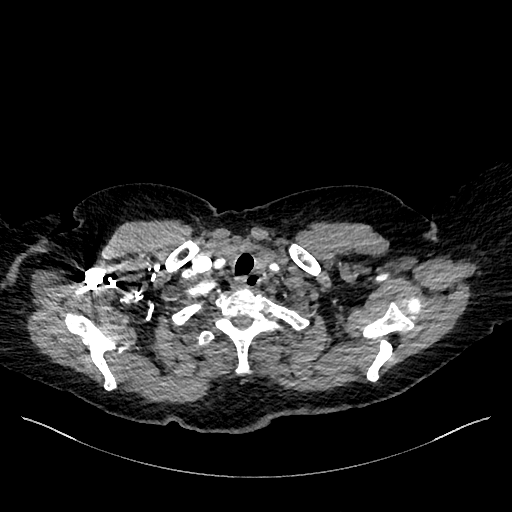

[Series 8: cor · coronal · 0.51mm/px · 1 of 151 slices shown]
[im 76/151  mediastinal]
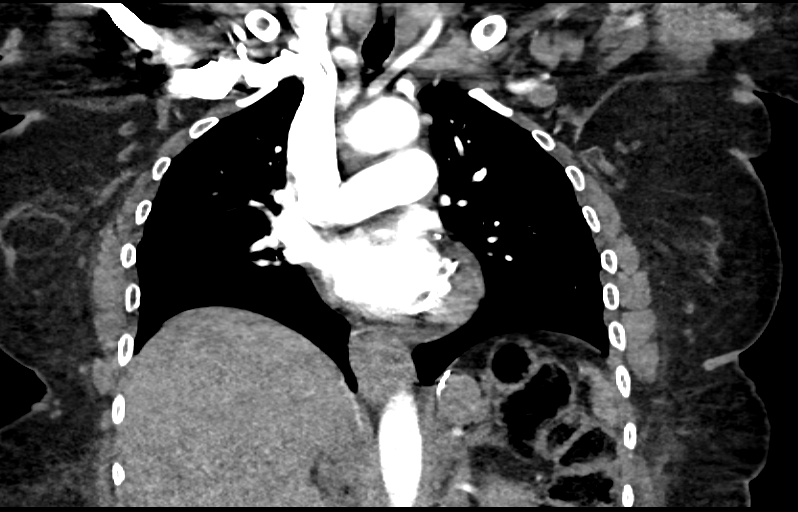

[18 of 36 positions shown; findings below may reference images not displayed]

FINDINGS: Cardiovascular: Good contrast bolus timing in the pulmonary arterial
tree. Respiratory motion artifact in the lower lobes. No pulmonary
artery filling defect identified.

Negative visible aorta aside from minor atherosclerosis. Calcified
coronary artery atherosclerosis or stent is evident on series 7,
image 162. Cardiac size at the upper limits of normal. No
pericardial effusion.

Mediastinum/Nodes: Mildly dilated esophagus. Gastric hiatal hernia
and postoperative changes to the stomach in the abdomen are noted.
No mediastinal lymphadenopathy.

Lungs/Pleura: Major airways are patent. No pleural effusion. No
consolidation. Mild pulmonary mosaic attenuation, mostly in the
upper lobes. Otherwise there is only minor dependent pulmonary
atelectasis.

Upper Abdomen: Retained low-density stool in the visible transverse
colon. Negative visible liver, spleen, kidneys. Partially visible
postoperative changes to the stomach, unclear whether there is a
gastrojejunostomy.

Musculoskeletal: No acute osseous abnormality identified.

Review of the MIP images confirms the above findings.
IMPRESSION: 1. Mild lower lobe respiratory motion artifact. No pulmonary embolus
identified.
2. Mild gas trapping and atelectasis suspected in the lungs.
3. Coronary artery atherosclerosis. Mild cardiomegaly. Negative
visible aorta.
4. Mildly dilated esophagus. Chronic postoperative changes to the
stomach.

## 2018-10-23 NOTE — Therapy (Signed)
[place], [place], [address] Phone: [phone]   Fax:  [phone]  Physical Therapy Evaluation  Patient Details  Name: Alexandra Tran MRN: [id number] Date of Birth: 1946-02-16 Referring Provider (PT): Basil Dess, MD   Encounter Date: 10/23/2018  PT End of Session - 10/23/18 1121    Visit Number  1    Number of Visits  16    PT Start Time  1108    PT Stop Time  1155    PT Time Calculation (min)  47 min    Activity Tolerance  Patient tolerated treatment well    Behavior During Therapy  [facility] for tasks assessed/performed       Past Medical History:  Diagnosis Date  . Complication of anesthesia   . Diabetes mellitus without complication ([place])   . DJD (degenerative joint disease)   . Hypertension   . PONV (postoperative nausea and vomiting)   . PVD (peripheral vascular disease) ([place])     Past Surgical History:  Procedure Laterality Date  . ABDOMINAL HYSTERECTOMY    . BREAST EXCISIONAL BIOPSY Right    negative  . CATARACT EXTRACTION, BILATERAL    . EYE SURGERY    . GASTRIC BYPASS  2001  . TOTAL KNEE ARTHROPLASTY Right 07/13/2018  . TOTAL KNEE ARTHROPLASTY Right 07/13/2018   Procedure: RIGHT TOTAL KNEE ARTHROPLASTY;  Surgeon: Jessy Oto, MD;  Location: [facility];  Service: Orthopedics;  Laterality: Right;  Needs RNFA    There were no vitals filed for this visit.   Subjective Assessment - 10/23/18 1113    Subjective  Pt arriving with her daughter who is Engineer, mining for PT evaluation. Pt s/p R TKR on 07/13/18. Pt was previously being seen for HHPT in [place] when she was staying with her other daughter.  Pt amb with small base quad cane with cane turned backward it with her R UE pt carring it with her R UE    Patient is accompained by:  Family member   daughter   Pertinent History  TKR on 07/13/18, PVD, prior back surgery,     Limitations  Standing;Walking;House hold activities    Diagnostic tests  X-ray after surgery    Patient Stated Goals  walk without hurting    Currently in Pain?  Yes    Pain Score  5     Pain Location  Knee    Pain Orientation  Right    Pain Descriptors / Indicators  Aching    Pain Type  Surgical pain    Pain Onset  More than a month ago    Pain Frequency  Intermittent    Aggravating Factors   walking, standing    Pain Relieving Factors  it varies and depends on activities, resting is better         [facility] PT Assessment - 10/23/18 0001      Assessment   Medical Diagnosis  R TKR    Referring Provider (PT)  Basil Dess, MD    Onset Date/Surgical Date  07/13/18    Hand Dominance  Right    Prior Therapy  HHPT in [place] while staying with her other daughter      Precautions   Precautions  None      Restrictions   Weight Bearing Restrictions  No      Balance Screen   Has the patient fallen in the past 6 months  No    Is the patient reluctant  to leave their home because of a fear of falling?   No      Home Environment   Living Environment  Private residence    Living Arrangements  Other relatives    Available Help at Discharge  Family    Type of [place] to enter    Entrance Stairs-Number of Steps  3      Prior Function   Level of Independence  Independent with community mobility with device      Cognition   Overall Cognitive Status  Within Functional Limits for tasks assessed      Observation/Other Assessments   Focus on Therapeutic Outcomes (FOTO)   N/A   limited time during eval due to pt being late     Posture/Postural Control   Posture/Postural Control  Postural limitations    Postural Limitations  Rounded Shoulders;Forward head;Increased lumbar lordosis;Increased thoracic kyphosis    Posture Comments  Pt was instructed in posture correction with verbal reminders throughout the evaluation      ROM / Strength   AROM / PROM / Strength  AROM;PROM;Strength      AROM   Overall AROM    Deficits    AROM Assessment Site  Knee    Right/Left Knee  Right;Left    Right Knee Extension  8   unable to reach full extension   Right Knee Flexion  85    Left Knee Extension  3    Left Knee Flexion  110      PROM   Overall PROM   Deficits    PROM Assessment Site  Knee    Right/Left Knee  Right    Right Knee Extension  5    Right Knee Flexion  90      Strength   Overall Strength  Deficits    Strength Assessment Site  Knee    Right/Left Knee  Right;Left    Right Knee Flexion  3+/5    Right Knee Extension  3+/5    Left Knee Flexion  4/5    Left Knee Extension  4/5      Flexibility   Soft Tissue Assessment /Muscle Length  --   limited hamstring flexibility: R SLR: 70 degrees     Palpation   Patella mobility  limited by R knee swelling    Palpation comment  R knee circumference: 51.5 centimeters at midpatella, L knee: 48 centimeters at midpatella       Transfers   Five time sit to stand comments   20.36 seconds with UE support      Ambulation/Gait   Ambulation/Gait  Yes    Ambulation Distance (Feet)  50 Feet    Assistive device  Small based quad cane    Gait Pattern  Step-through pattern;Decreased step length - right;Decreased step length - left;Decreased stance time - right;Decreased stride length;Decreased dorsiflexion - right;Right flexed knee in stance;Antalgic    Gait Comments  Kasandra Knudsen was turned backward when walking into therapy clinic, pt was instructed in proper way to hold her cane                Objective measurements completed on examination: See above findings.              PT Education - 10/23/18 1129    Education Details  Reveiwed HEP, gait training using SBQC    Person(s) Educated  Patient    Methods  Explanation;Handout;Demonstration    Comprehension  Verbalized understanding;Returned demonstration       PT Short Term Goals - 10/23/18 1130      PT SHORT TERM GOAL #1   Title  Pt will be independent with her HEP.    Time  4     Period  Weeks    Status  New    Target Date  11/13/18      PT SHORT TERM GOAL #2   Title  Pt will be able to perform supine to sit without having to assist her R LE.     Baseline  pt having to lift her R LE onto mat table.     Time  4    Period  Weeks    Status  New    Target Date  11/20/18      PT SHORT TERM GOAL #3   Title  Pt will be able to improve 5 times sit to stand to </= 15 seconds.     Baseline  10/23/2018 = 20.36 seconds with UE support        PT Long Term Goals - 10/23/18 1139      PT LONG TERM GOAL #1   Title  Pt will be able to amb 500 feet with LRAD with step through gait pattern with pain in R knee </= 3/10.     Baseline  pt amb 50 feet with SBQC with pain 8/10 in R knee    Time  8    Period  Weeks    Status  New    Target Date  12/18/18      PT LONG TERM GOAL #2   Title  Pt will improve her R knee flexion to >/= 110 degrees in order to improve functional mobility.      Baseline  AROM R knee: 8-85 degrees    Time  8    Period  Weeks    Status  New    Target Date  12/18/18      PT LONG TERM GOAL #3   Title  pt will improve her R knee flexion and extension strength to >/= 4+/5 in order to improve gait and functional mobility.     Baseline  R LE grossly 3+/5    Time  8    Period  Weeks    Status  New    Target Date  12/18/18      PT LONG TERM GOAL #4   Title  Pt will improve R knee extension to </= 3 degrees of flexion in order to improve gait.     Baseline  AROM R knee : 8-85 degrees    Time  8    Period  Weeks    Status  New    Target Date  12/18/18             Plan - 10/23/18 1303    Clinical Impression Statement  Pt arriving to therapy with her daughter who is a nurse practionier who had to leave 1/2 way through the evaluation. Pt s/p R TKR on 07/13/18. Pt presenting with R knee edema with limited AROM of 8-85 degrees. Pt also with antalgic gait and using a SBQC. Pt with poor posture but able to correct with only verbal cuing. pt also with  decreased strength in R LE. Pt was instruted in a HEP and pt's daughter was instructed when she returned. Pt is curretnly living with her daughter here in [place] so she can be close to ther physician  and complete rehab. Pt reporting recent back surgery in 2016 and explains she has been weak for a couple of years. Pt's goals are to amb independently and without pain. Skilled PT needed to progress pt toward the goals set for increased functional mobility and gait for pt to return to living independently in her own home.     History and Personal Factors relevant to plan of care:  PVD, recent back surgery in 2016, obesity, R TKR on 07/13/18    Clinical Presentation  Stable    Clinical Decision Making  Low    Rehab Potential  Good    PT Frequency  2x / week    PT Duration  8 weeks    PT Treatment/Interventions  ADLs/Self Care Home Management;Cryotherapy;Electrical Stimulation;Moist Heat;Ultrasound;Functional mobility training;Stair training;Gait training;Therapeutic activities;Therapeutic exercise;Balance training;Neuromuscular re-education;Manual techniques;Patient/family education;Passive range of motion    PT Next Visit Plan  TKR protocol, gait training, modalities as needed, vasopneumatic    PT Home Exercise Plan  SLR, hamstring stretches, QS, heel slides, sit to stand    Consulted and Agree with Plan of Care  Patient;Family member/caregiver    Family Member Consulted  Vicente Males, pt's daughter       Patient will benefit from skilled therapeutic intervention in order to improve the following deficits and impairments:  Abnormal gait, Postural dysfunction, Pain, Difficulty walking, Decreased activity tolerance, Decreased range of motion, Decreased strength, Decreased balance, Obesity  Visit Diagnosis: Acute pain of right knee  Stiffness of right knee, not elsewhere classified  Muscle weakness (generalized)  Difficulty in walking, not elsewhere classified     Problem List Patient Active  Problem List   Diagnosis Date Noted  . Anemia 08/05/2018  . Acute blood loss anemia 07/14/2018    Class: Acute  . Tachycardia 07/14/2018  . Diabetes mellitus with hyperglycemia ([place]) 07/14/2018  . Essential hypertension 07/14/2018  . Unilateral primary osteoarthritis, right knee 07/13/2018    Class: Chronic  . Peripheral vascular disease of extremity ([place]) 07/13/2018    Class: Chronic  . S/P TKR (total knee replacement) using cement, right 07/13/2018    Oretha Caprice, PT 10/23/2018, 1:10 PM  [place], [place], [address] Phone: [phone]   Fax:  [phone]  Name: Alexandra Tran MRN: [id number] Date of Birth: 1946/06/14

## 2018-10-26 NOTE — Telephone Encounter (Signed)
Pt called in about the referral she requested states that she would like a phone call back, letting her know if Dr. Louanne Skye is going to approval giving her a referral

## 2018-10-26 NOTE — Telephone Encounter (Signed)
Pt has called in again about her medication refill that she called about last week 10/21/2018

## 2018-10-27 ENCOUNTER — Encounter

## 2018-10-27 NOTE — Telephone Encounter (Signed)
Patient called [facility] [address]. Patient asked if all of her medication can be sent to  This pharmacy?

## 2018-10-28 ENCOUNTER — Telehealth: Payer: Self-pay | Admitting: Physical Therapy

## 2018-10-28 ENCOUNTER — Encounter: Payer: Medicare Other | Admitting: Physical Therapy

## 2018-10-28 NOTE — Telephone Encounter (Signed)
Talked with patient about missed visit.   She forgot.  She was upset she forgot. I asked her to not be upset and asked her to do her home exercises.  Date and time of her next appointment was given.  Melvenia Needles  PTA

## 2018-10-29 ENCOUNTER — Encounter: Payer: Self-pay | Admitting: Physical Therapy

## 2018-10-29 ENCOUNTER — Ambulatory Visit: Payer: Medicare Other | Admitting: Physical Therapy

## 2018-10-29 DIAGNOSIS — M25561 Pain in right knee: Secondary | ICD-10-CM | POA: Diagnosis not present

## 2018-10-29 DIAGNOSIS — M25661 Stiffness of right knee, not elsewhere classified: Secondary | ICD-10-CM

## 2018-10-29 DIAGNOSIS — M6281 Muscle weakness (generalized): Secondary | ICD-10-CM

## 2018-10-29 DIAGNOSIS — R262 Difficulty in walking, not elsewhere classified: Secondary | ICD-10-CM

## 2018-10-29 NOTE — Therapy (Signed)
[place], [place], [address] Phone: [phone]   Fax:  [phone]  Physical Therapy Treatment  Patient Details  Name: Alexandra Tran MRN: [id number] Date of Birth: 03/25/1946 Referring Provider (PT): Basil Dess, MD   Encounter Date: 10/29/2018  PT End of Session - 10/29/18 1743    Visit Number  2    Number of Visits  16    PT Start Time  1696    PT Stop Time  1718    PT Time Calculation (min)  43 min    Activity Tolerance  Patient tolerated treatment well    Behavior During Therapy  [facility] for tasks assessed/performed       Past Medical History:  Diagnosis Date  . Complication of anesthesia   . Diabetes mellitus without complication ([place])   . DJD (degenerative joint disease)   . Hypertension   . PONV (postoperative nausea and vomiting)   . PVD (peripheral vascular disease) ([place])     Past Surgical History:  Procedure Laterality Date  . ABDOMINAL HYSTERECTOMY    . BREAST EXCISIONAL BIOPSY Right    negative  . CATARACT EXTRACTION, BILATERAL    . EYE SURGERY    . GASTRIC BYPASS  2001  . TOTAL KNEE ARTHROPLASTY Right 07/13/2018  . TOTAL KNEE ARTHROPLASTY Right 07/13/2018   Procedure: RIGHT TOTAL KNEE ARTHROPLASTY;  Surgeon: Jessy Oto, MD;  Location: [facility];  Service: Orthopedics;  Laterality: Right;  Needs RNFA    There were no vitals filed for this visit.  Subjective Assessment - 10/29/18 1702    Subjective  Patient is doing the exercise at home.    She always walks with something out of house.   I walk [place] buggy [place] [employer].      Currently in Pain?  Yes    Pain Score  --   just a little. no number given   Pain Location  Knee    Pain Orientation  Right;Lateral    Pain Descriptors / Indicators  Aching;Tender    Pain Type  Surgical pain    Pain Frequency  Intermittent    Aggravating Factors   walking and standing    Pain Relieving Factors  resting.  ice    Effect of Pain on  Daily Activities  extra time, limited walking,  not sleeping well.                       [place] Adult PT Treatment/Exercise - 10/29/18 0001      Self-Care   Self-Care  Other Self-Care Comments   ways to get stretching done with minimal pain.     Knee/Hip Exercises: Stretches   Passive Hamstring Stretch  3 reps;30 seconds    Other Knee/Hip Stretches  sitting with knee flexed as far as it will go,   then tap toes  5   seconds.    10 reps   cues   HEP.     Knee/Hip Exercises: Aerobic   Nustep  5 minutes L5,  UE/LE      Knee/Hip Exercises: Standing   Functional Squat  5 reps    Functional Squat Limitations  cued for equal weight shifting    SLS  3 seconds best after several reps    Gait Training  weight shifting side to side to increase weightbearing      Knee/Hip Exercises: Seated   Long Arc Quad  10 reps  Long Arc Quad Limitations  cued foot position    Hamstring Curl  Right;1 set;10 reps    Hamstring Limitations  red band HEP    Sit to [employer]  5 reps   low mat with 2 pillows to lift     Knee/Hip Exercises: Supine   Quad Sets  10 reps    Quad Sets Limitations  needed support under knee    sluggish initially   Heel Slides  AAROM;Right;2 sets;10 reps    Heel Slides Limitations  strap helpful    Patellar Mobs  yes hARD TO GET PATIENT TO RELAX.      Manual Therapy   Manual therapy comments  P/A glides with movement into flexion             PT Education - 10/29/18 1742    Education Details  HEP,  exercise form    Person(s) Educated  Patient    Methods  Explanation;Demonstration;Verbal cues;Handout;Tactile cues    Comprehension  Returned demonstration;Verbalized understanding;Need further instruction       PT Short Term Goals - 10/23/18 1130      PT SHORT TERM GOAL #1   Title  Pt will be independent with her HEP.    Time  4    Period  Weeks    Status  New    Target Date  11/13/18      PT SHORT TERM GOAL #2   Title  Pt will be able to perform  supine to sit without having to assist her R LE.     Baseline  pt having to lift her R LE onto mat table.     Time  4    Period  Weeks    Status  New    Target Date  11/20/18      PT SHORT TERM GOAL #3   Title  Pt will be able to improve 5 times sit to stand to </= 15 seconds.     Baseline  10/23/2018 = 20.36 seconds with UE support        PT Long Term Goals - 10/23/18 1139      PT LONG TERM GOAL #1   Title  Pt will be able to amb 500 feet with LRAD with step through gait pattern with pain in R knee </= 3/10.     Baseline  pt amb 50 feet with SBQC with pain 8/10 in R knee    Time  8    Period  Weeks    Status  New    Target Date  12/18/18      PT LONG TERM GOAL #2   Title  Pt will improve her R knee flexion to >/= 110 degrees in order to improve functional mobility.      Baseline  AROM R knee: 8-85 degrees    Time  8    Period  Weeks    Status  New    Target Date  12/18/18      PT LONG TERM GOAL #3   Title  pt will improve her R knee flexion and extension strength to >/= 4+/5 in order to improve gait and functional mobility.     Baseline  R LE grossly 3+/5    Time  8    Period  Weeks    Status  New    Target Date  12/18/18      PT LONG TERM GOAL #4   Title  Pt will improve R knee extension  to </= 3 degrees of flexion in order to improve gait.     Baseline  AROM R knee : 8-85 degrees    Time  8    Period  Weeks    Status  New    Target Date  12/18/18            Plan - 10/29/18 1743    Clinical Impression Statement  92 AA knee flexion.  post session she declined the need for cold pack.  I have just a little pain.       PT Next Visit Plan  TKR protocol, gait training, modalities as needed, vasopneumatic    PT Home Exercise Plan  SLR, hamstring stretches, QS, heel slides ( with toe taps), sit to stand.  hamstring band    Consulted and Agree with Plan of Care  Patient       Patient will benefit from skilled therapeutic intervention in order to improve the  following deficits and impairments:     Visit Diagnosis: Acute pain of right knee  Stiffness of right knee, not elsewhere classified  Muscle weakness (generalized)  Difficulty in walking, not elsewhere classified     Problem List Patient Active Problem List   Diagnosis Date Noted  . Anemia 08/05/2018  . Acute blood loss anemia 07/14/2018    Class: Acute  . Tachycardia 07/14/2018  . Diabetes mellitus with hyperglycemia ([place]) 07/14/2018  . Essential hypertension 07/14/2018  . Unilateral primary osteoarthritis, right knee 07/13/2018    Class: Chronic  . Peripheral vascular disease of extremity ([place]) 07/13/2018    Class: Chronic  . S/P TKR (total knee replacement) using cement, right 07/13/2018    Maliik Karner PTA 10/29/2018, 5:46 PM  [place], [place], [address] Phone: [phone]   Fax:  [phone]  Name: Alexandra Tran MRN: [id number] Date of Birth: Aug 09, 1946

## 2018-10-29 NOTE — Patient Instructions (Signed)
Hamstring Curl: Resisted (Sitting)       Sit with right leg extended. Against yellow resistance band, bend knee and draw foot backward. Complete _1-3__ sets of _10__ repetitions. Perform   1 to 2 ___ sessions per day.  http://gtsc.exer.us/231      SIT with knee bent as far as it can go.    Tap your toes  10 seconds.    Relax .  Straighten.   Bend and Repeat.  2 to 3 sets 2 to 3 X a day.   Copyright  VHI. All rights reserved.

## 2018-11-02 ENCOUNTER — Ambulatory Visit: Payer: Medicare Other | Attending: Specialist | Admitting: Physical Therapy

## 2018-11-02 ENCOUNTER — Encounter: Payer: Self-pay | Admitting: Physical Therapy

## 2018-11-02 DIAGNOSIS — R262 Difficulty in walking, not elsewhere classified: Secondary | ICD-10-CM | POA: Diagnosis present

## 2018-11-02 DIAGNOSIS — M6281 Muscle weakness (generalized): Secondary | ICD-10-CM

## 2018-11-02 DIAGNOSIS — M25561 Pain in right knee: Secondary | ICD-10-CM | POA: Diagnosis not present

## 2018-11-02 DIAGNOSIS — M25661 Stiffness of right knee, not elsewhere classified: Secondary | ICD-10-CM

## 2018-11-02 DIAGNOSIS — R600 Localized edema: Secondary | ICD-10-CM | POA: Insufficient documentation

## 2018-11-02 NOTE — Therapy (Signed)
[name], [place], [address] Phone: [phone]   Fax:  [phone]  Physical Therapy Treatment  Patient Details  Name: Alexandra Tran MRN: [id number] Date of Birth: [birth year] Referring Provider (PT): Basil Dess, MD   Encounter Date: 11/02/2018  PT End of Session - 11/02/18 1321    Visit Number  3    Number of Visits  16    PT Start Time  7262    PT Stop Time  0355    PT Time Calculation (min)  53 min    Activity Tolerance  Patient tolerated treatment well    Behavior During Therapy  [facility] for tasks assessed/performed       Past Medical History:  Diagnosis Date  . Complication of anesthesia   . Diabetes mellitus without complication ([place])   . DJD (degenerative joint disease)   . Hypertension   . PONV (postoperative nausea and vomiting)   . PVD (peripheral vascular disease) ([place])     Past Surgical History:  Procedure Laterality Date  . ABDOMINAL HYSTERECTOMY    . BREAST EXCISIONAL BIOPSY Right    negative  . CATARACT EXTRACTION, BILATERAL    . EYE SURGERY    . GASTRIC BYPASS  2001  . TOTAL KNEE ARTHROPLASTY Right 07/13/2018  . TOTAL KNEE ARTHROPLASTY Right 07/13/2018   Procedure: RIGHT TOTAL KNEE ARTHROPLASTY;  Surgeon: Jessy Oto, MD;  Location: [facility];  Service: Orthopedics;  Laterality: Right;  Needs RNFA    There were no vitals filed for this visit.  Subjective Assessment - 11/02/18 1152    Subjective  I have a ingrown toenail.  I did a little exercise yesterday.      Currently in Pain?  Yes    Pain Score  --   mild,  no number,  I just can't think   Pain Location  Knee    Pain Orientation  Right;Lateral    Pain Descriptors / Indicators  Aching;Tender    Pain Type  Surgical pain    Aggravating Factors   walking and standing    Pain Relieving Factors  resting ice ,  using rollator    Effect of Pain on Daily Activities  limited walking, not sleeping ADL difficult    Multiple  Pain Sites  --   right great toe 7-8 /10 ,  soaking with epsom salts.                        [place] [facility] Adult PT Treatment/Exercise - 11/02/18 0001      Self-Care   Self-Care  --   get toe checked,  be sure there is no infection. RICE benifi     Knee/Hip Exercises: Stretches   Passive Hamstring Stretch  3 reps;30 seconds    Gastroc Stretch  2 reps;30 seconds    Other Knee/Hip Stretches  sitting with knee flexed as far as it will go,   .    10 reps   cues   HEP.     Knee/Hip Exercises: Aerobic   Nustep  5 minutes L5,  UE/LE      Knee/Hip Exercises: Standing   Knee Flexion  1 set;10 reps      Knee/Hip Exercises: Seated   Long Arc Quad  10 reps    Long Arc Quad Weight  3 lbs.    Hamstring Curl  Right;2 sets;10 reps    Hamstring Limitations  red band    Sit  to [facility]  10 reps   needs hands,  cues for weight bearing     Knee/Hip Exercises: Supine   Heel Slides  AAROM;Right;2 sets;10 reps    Heel Slides Limitations  strap    Bridges  10 reps;2 sets;Both      Cryotherapy   Number Minutes Cryotherapy  10 Minutes    Cryotherapy Location  Knee    Type of Cryotherapy  --   cold pack     Manual Therapy   Manual therapy comments  P/A glides with movement into flexion             PT Education - 11/02/18 1321    Education Details  exercise form.  RICE    Methods  Demonstration;Tactile cues;Explanation;Verbal cues    Comprehension  Verbalized understanding;Returned demonstration       PT Short Term Goals - 10/23/18 1130      PT SHORT TERM GOAL #1   Title  Pt will be independent with her HEP.    Time  4    Period  Weeks    Status  New    Target Date  11/13/18      PT SHORT TERM GOAL #2   Title  Pt will be able to perform supine to sit without having to assist her R LE.     Baseline  pt having to lift her R LE onto mat table.     Time  4    Period  Weeks    Status  New    Target Date  11/20/18      PT SHORT TERM GOAL #3   Title  Pt will be able to  improve 5 times sit to stand to </= 15 seconds.     Baseline  10/23/2018 = 20.36 seconds with UE support        PT Long Term Goals - 10/23/18 1139      PT LONG TERM GOAL #1   Title  Pt will be able to amb 500 feet with LRAD with step through gait pattern with pain in R knee </= 3/10.     Baseline  pt amb 50 feet with SBQC with pain 8/10 in R knee    Time  8    Period  Weeks    Status  New    Target Date  12/18/18      PT LONG TERM GOAL #2   Title  Pt will improve her R knee flexion to >/= 110 degrees in order to improve functional mobility.      Baseline  AROM R knee: 8-85 degrees    Time  8    Period  Weeks    Status  New    Target Date  12/18/18      PT LONG TERM GOAL #3   Title  pt will improve her R knee flexion and extension strength to >/= 4+/5 in order to improve gait and functional mobility.     Baseline  R LE grossly 3+/5    Time  8    Period  Weeks    Status  New    Target Date  12/18/18      PT LONG TERM GOAL #4   Title  Pt will improve R knee extension to </= 3 degrees of flexion in order to improve gait.     Baseline  AROM R knee : 8-85 degrees    Time  8    Period  Weeks  Status  New    Target Date  12/18/18            Plan - 11/02/18 1322    Clinical Impression Statement  96 AA knee flexion.  Girth  knee joint line  51 cm.  Right foot edematous , great toe red.  Asked to to call Md , show daughter,  to address Toe pain anfd reddness.  patient was able to tolerate increased stretching in knee.   Mild knee pain at end of session.  it was hard for her to don shoe  after it was removed,      PT Next Visit Plan  TKR protocol, gait training, modalities as needed, vasopneumatic    PT Home Exercise Plan  SLR, hamstring stretches, QS, heel slides ( with toe taps), sit to stand.  hamstring band    Consulted and Agree with Plan of Care  Patient       Patient will benefit from skilled therapeutic intervention in order to improve the following deficits and  impairments:     Visit Diagnosis: Acute pain of right knee  Stiffness of right knee, not elsewhere classified  Muscle weakness (generalized)  Difficulty in walking, not elsewhere classified     Problem List Patient Active Problem List   Diagnosis Date Noted  . Anemia 08/05/2018  . Acute blood loss anemia 07/14/2018    Class: Acute  . Tachycardia 07/14/2018  . Diabetes mellitus with hyperglycemia ([place]) 07/14/2018  . Essential hypertension 07/14/2018  . Unilateral primary osteoarthritis, right knee 07/13/2018    Class: Chronic  . Peripheral vascular disease of extremity ([place]) 07/13/2018    Class: Chronic  . S/P TKR (total knee replacement) using cement, right 07/13/2018    HARRIS,KAREN  PTA 11/02/2018, 1:27 PM  [facility] [address] [place], [place], [address] Phone: [phone]   Fax:  [phone]  Name: TYHESHA DUTSON MRN: [id number] Date of Birth: Dec 26, 1945

## 2018-11-02 NOTE — Patient Instructions (Signed)
Call MD or ask daughter to check out great toe.

## 2018-11-05 ENCOUNTER — Ambulatory Visit: Payer: Medicare Other | Admitting: Physical Therapy

## 2018-11-05 ENCOUNTER — Encounter: Payer: Self-pay | Admitting: Physical Therapy

## 2018-11-05 DIAGNOSIS — M25661 Stiffness of right knee, not elsewhere classified: Secondary | ICD-10-CM

## 2018-11-05 DIAGNOSIS — R262 Difficulty in walking, not elsewhere classified: Secondary | ICD-10-CM

## 2018-11-05 DIAGNOSIS — M25561 Pain in right knee: Secondary | ICD-10-CM | POA: Diagnosis not present

## 2018-11-05 DIAGNOSIS — M6281 Muscle weakness (generalized): Secondary | ICD-10-CM

## 2018-11-05 NOTE — Therapy (Signed)
[place], [place], [address] Phone: [phone]   Fax:  [phone]  Physical Therapy Treatment  Patient Details  Name: Alexandra Tran MRN: [id number] Date of Birth: 1946-01-17 Referring Provider (PT): Basil Dess, MD   Encounter Date: 11/05/2018  PT End of Session - 11/05/18 1125    Visit Number  4    Number of Visits  16    PT Start Time  1102    PT Stop Time  1155    PT Time Calculation (min)  53 min    Activity Tolerance  Patient tolerated treatment well    Behavior During Therapy  [facility] for tasks assessed/performed       Past Medical History:  Diagnosis Date  . Complication of anesthesia   . Diabetes mellitus without complication ([place])   . DJD (degenerative joint disease)   . Hypertension   . PONV (postoperative nausea and vomiting)   . PVD (peripheral vascular disease) ([place])     Past Surgical History:  Procedure Laterality Date  . ABDOMINAL HYSTERECTOMY    . BREAST EXCISIONAL BIOPSY Right    negative  . CATARACT EXTRACTION, BILATERAL    . EYE SURGERY    . GASTRIC BYPASS  2001  . TOTAL KNEE ARTHROPLASTY Right 07/13/2018  . TOTAL KNEE ARTHROPLASTY Right 07/13/2018   Procedure: RIGHT TOTAL KNEE ARTHROPLASTY;  Surgeon: Jessy Oto, MD;  Location: [facility];  Service: Orthopedics;  Laterality: Right;  Needs RNFA    There were no vitals filed for this visit.  Subjective Assessment - 11/05/18 1117    Subjective  Pt arriving to day reporting more pain in her ingrown toenail on the right great toe. Pt reporting 8/10 pain in her R toe. Pt reporting mild intermittent pain in her R  knee.     Pertinent History  TKR on 07/13/18, PVD, prior back surgery,     Limitations  Standing;Walking;House hold activities    Diagnostic tests  X-ray after surgery    Patient Stated Goals  walk without hurting    Currently in Pain?  Yes    Pain Score  4     Pain Location  Knee    Pain Orientation   Right;Medial    Pain Descriptors / Indicators  Aching;Sore;Tightness    Pain Type  Surgical pain    Pain Onset  More than a month ago    Pain Frequency  Intermittent    Multiple Pain Sites  Yes    Pain Score  8    Pain Location  Toe (Comment which one)    Pain Orientation  Right    Pain Descriptors / Indicators  Shooting;Sore    Pain Type  Acute pain    Pain Onset  1 to 4 weeks ago    Pain Frequency  Constant    Aggravating Factors   walking, touching my "big toe"    Effect of Pain on Daily Activities  resting                       [facility] Adult PT Treatment/Exercise - 11/05/18 0001      Knee/Hip Exercises: Stretches   Active Hamstring Stretch  Right;3 reps;20 seconds    Active Hamstring Stretch Limitations  seated    Gastroc Stretch  2 reps;30 seconds      Knee/Hip Exercises: Aerobic   Nustep  6 minutes L5      Knee/Hip Exercises: Seated  Long Arc [employer]  AROM;Strengthening;10 reps    Heel Slides  20 reps    Hamstring Curl  Right;2 sets;10 reps    Hamstring Limitations  red band    Sit to [place]  10 reps   needs hands,  cues for weight bearing     Knee/Hip Exercises: Supine   Quad Sets  AROM;Strengthening;Right;15 reps    Heel Slides  AAROM;Right;2 sets;10 reps    Heel Slides Limitations  strap    Bridges  10 reps;2 sets;Both    Single Leg Bridge  Strengthening;Right;2 sets;10 reps      Modalities   Modalities  Vasopneumatic      Vasopneumatic   Number Minutes Vasopneumatic   15 minutes    Vasopnuematic Location   Knee    Vasopneumatic Pressure  Low    Vasopneumatic Temperature   34      Manual Therapy   Manual Therapy  Passive ROM    Manual therapy comments  P/A glides with movement into flexion    Passive ROM  flexion and extension with overpressure             PT Education - 11/05/18 1124    Education Details  RICE, HEP review    Person(s) Educated  Patient    Methods  Explanation;Demonstration    Comprehension  Verbalized  understanding;Returned demonstration       PT Short Term Goals - 11/05/18 1128      PT SHORT TERM GOAL #1   Title  Pt will be independent with her HEP.    Time  4    Period  Weeks    Status  On-going      PT SHORT TERM GOAL #2   Title  Pt will be able to perform supine to sit without having to assist her R LE.     Baseline  pt having to lift her R LE onto mat table.     Time  4    Period  Weeks    Status  On-going      PT SHORT TERM GOAL #3   Title  Pt will be able to improve 5 times sit to stand to </= 15 seconds.     Baseline  10/23/2018 = 20.36 seconds with UE support    Status  New        PT Long Term Goals - 11/05/18 1129      PT LONG TERM GOAL #1   Title  Pt will be able to amb 500 feet with LRAD with step through gait pattern with pain in R knee </= 3/10.     Baseline  pt amb 50 feet with SBQC with pain 8/10 in R knee    Time  8    Period  Weeks    Status  New      PT LONG TERM GOAL #2   Title  Pt will improve her R knee flexion to >/= 110 degrees in order to improve functional mobility.      Baseline  AROM R knee: 8-85 degrees    Time  8    Period  Weeks      PT LONG TERM GOAL #3   Title  pt will improve her R knee flexion and extension strength to >/= 4+/5 in order to improve gait and functional mobility.     Baseline  R LE grossly 3+/5    Time  8    Period  Weeks    Status  New      PT LONG TERM GOAL #4   Title  Pt will improve R knee extension to </= 3 degrees of flexion in order to improve gait.     Baseline  AROM R knee : 8-85 degrees    Time  8    Period  Weeks    Status  New            Plan - 11/05/18 1131    Clinical Impression Statement  Pt tolerating all exercises with limitation on standing exercises due to R ingrown toenail on R great toe. Pt still reporting 4-5/10 pain in her R knee. Pt AROM:  8-95 degrees. Continue skilled PT to progress toward goals set.     Rehab Potential  Good    PT Frequency  2x / week    PT Duration  8  weeks    PT Treatment/Interventions  ADLs/Self Care Home Management;Cryotherapy;Electrical Stimulation;Moist Heat;Ultrasound;Functional mobility training;Stair training;Gait training;Therapeutic activities;Therapeutic exercise;Balance training;Neuromuscular re-education;Manual techniques;Patient/family education;Passive range of motion    PT Next Visit Plan  TKR protocol, gait training, modalities as needed, vasopneumatic    PT Home Exercise Plan  SLR, hamstring stretches, QS, heel slides ( with toe taps), sit to stand.  hamstring band    Consulted and Agree with Plan of Care  Patient       Patient will benefit from skilled therapeutic intervention in order to improve the following deficits and impairments:  Abnormal gait, Postural dysfunction, Pain, Difficulty walking, Decreased activity tolerance, Decreased range of motion, Decreased strength, Decreased balance, Obesity  Visit Diagnosis: Acute pain of right knee  Stiffness of right knee, not elsewhere classified  Muscle weakness (generalized)  Difficulty in walking, not elsewhere classified     Problem List Patient Active Problem List   Diagnosis Date Noted  . Anemia 08/05/2018  . Acute blood loss anemia 07/14/2018    Class: Acute  . Tachycardia 07/14/2018  . Diabetes mellitus with hyperglycemia ([place]) 07/14/2018  . Essential hypertension 07/14/2018  . Unilateral primary osteoarthritis, right knee 07/13/2018    Class: Chronic  . Peripheral vascular disease of extremity ([place]) 07/13/2018    Class: Chronic  . S/P TKR (total knee replacement) using cement, right 07/13/2018    Oretha Caprice, PT 11/05/2018, 11:50 AM  [facility] [address] [place], [place], [address] Phone: [phone]   Fax:  [phone]  Name: Alexandra Tran MRN: [id number] Date of Birth: [birth year]

## 2018-11-09 ENCOUNTER — Ambulatory Visit: Payer: Medicare Other | Admitting: Physical Therapy

## 2018-11-09 ENCOUNTER — Encounter: Payer: Self-pay | Admitting: Physical Therapy

## 2018-11-09 DIAGNOSIS — M25561 Pain in right knee: Secondary | ICD-10-CM | POA: Diagnosis not present

## 2018-11-09 DIAGNOSIS — M25661 Stiffness of right knee, not elsewhere classified: Secondary | ICD-10-CM

## 2018-11-09 DIAGNOSIS — R600 Localized edema: Secondary | ICD-10-CM

## 2018-11-09 DIAGNOSIS — R262 Difficulty in walking, not elsewhere classified: Secondary | ICD-10-CM

## 2018-11-09 DIAGNOSIS — M6281 Muscle weakness (generalized): Secondary | ICD-10-CM

## 2018-11-09 NOTE — Therapy (Signed)
[place], [place], [address] Phone: [phone]   Fax:  [phone]  Physical Therapy Treatment  Patient Details  Name: Alexandra Tran MRN: [id number] Date of Birth: 1945/11/17 Referring Provider (PT): Basil Dess, MD   Encounter Date: 11/09/2018  PT End of Session - 11/09/18 1028    Visit Number  5    Number of Visits  16    Date for PT Re-Evaluation  12/18/18    Authorization Type  MCR- PN at visit 10    PT Start Time  1019    PT Stop Time  1115    PT Time Calculation (min)  56 min    Activity Tolerance  Patient tolerated treatment well    Behavior During Therapy  [facility] for tasks assessed/performed       Past Medical History:  Diagnosis Date  . Complication of anesthesia   . Diabetes mellitus without complication ([place])   . DJD (degenerative joint disease)   . Hypertension   . PONV (postoperative nausea and vomiting)   . PVD (peripheral vascular disease) ([place])     Past Surgical History:  Procedure Laterality Date  . ABDOMINAL HYSTERECTOMY    . BREAST EXCISIONAL BIOPSY Right    negative  . CATARACT EXTRACTION, BILATERAL    . EYE SURGERY    . GASTRIC BYPASS  2001  . TOTAL KNEE ARTHROPLASTY Right 07/13/2018  . TOTAL KNEE ARTHROPLASTY Right 07/13/2018   Procedure: RIGHT TOTAL KNEE ARTHROPLASTY;  Surgeon: Jessy Oto, MD;  Location: [facility];  Service: Orthopedics;  Laterality: Right;  Needs RNFA    There were no vitals filed for this visit.  Subjective Assessment - 11/09/18 1026    Subjective  pt reports the ingrown toe nail is more painful than the knee.     Patient Stated Goals  walk without hurting    Currently in Pain?  Yes    Pain Score  3     Pain Location  Knee    Pain Orientation  Right    Pain Descriptors / Indicators  Aching    Aggravating Factors   weight bearing    Pain Relieving Factors  rest         [facility] PT Assessment - 11/09/18 0001      Assessment   Medical  Diagnosis  R TKR    Referring Provider (PT)  Basil Dess, MD    Onset Date/Surgical Date  07/13/18    Hand Dominance  Right      AROM   Right Knee Extension  9    Right Knee Flexion  95      Palpation   Palpation comment  R knee circumference: 51.5 centimeters at midpatella, L knee: 48 centimeters at [place] Adult PT Treatment/Exercise - 11/09/18 0001      Knee/Hip Exercises: Stretches   Passive Hamstring Stretch Limitations  seated EOB    Knee: Self-Stretch Limitations  5x10s supine with strap    Other Knee/Hip Stretches  standing gastroc stretch      Knee/Hip Exercises: Aerobic   Nustep  6 min L4      Knee/Hip Exercises: Standing   Heel Raises  15 reps    Other Standing Knee Exercises  standing posture with chair      Knee/Hip Exercises: Supine   Straight Leg Raises  15 reps  Straight Leg Raises Limitations  cues for quad set      Vasopneumatic   Number Minutes Vasopneumatic   15 minutes    Vasopnuematic Location   Knee    Vasopneumatic Pressure  Low    Vasopneumatic Temperature   34      Manual Therapy   Manual Therapy  Joint mobilization    Joint Mobilization  tibia on femur into ext               PT Short Term Goals - 11/05/18 1128      PT SHORT TERM GOAL #1   Title  Pt will be independent with her HEP.    Time  4    Period  Weeks    Status  On-going      PT SHORT TERM GOAL #2   Title  Pt will be able to perform supine to sit without having to assist her R LE.     Baseline  pt having to lift her R LE onto mat table.     Time  4    Period  Weeks    Status  On-going      PT SHORT TERM GOAL #3   Title  Pt will be able to improve 5 times sit to stand to </= 15 seconds.     Baseline  10/23/2018 = 20.36 seconds with UE support    Status  New        PT Long Term Goals - 11/05/18 1129      PT LONG TERM GOAL #1   Title  Pt will be able to amb 500 feet with LRAD with step through gait pattern with pain  in R knee </= 3/10.     Baseline  pt amb 50 feet with SBQC with pain 8/10 in R knee    Time  8    Period  Weeks    Status  New      PT LONG TERM GOAL #2   Title  Pt will improve her R knee flexion to >/= 110 degrees in order to improve functional mobility.      Baseline  AROM R knee: 8-85 degrees    Time  8    Period  Weeks      PT LONG TERM GOAL #3   Title  pt will improve her R knee flexion and extension strength to >/= 4+/5 in order to improve gait and functional mobility.     Baseline  R LE grossly 3+/5    Time  8    Period  Weeks    Status  New      PT LONG TERM GOAL #4   Title  Pt will improve R knee extension to </= 3 degrees of flexion in order to improve gait.     Baseline  AROM R knee : 8-85 degrees    Time  8    Period  Weeks    Status  New            Plan - 11/09/18 1304    Clinical Impression Statement  Pt is very fearful of pain and repeatedly stated "I wish there was just something they could give me"   She would like to return to the pool which I encouraged her to do since her incision is healed. She is wearing a below-knee comppression stalking and I asked her to purchase a thigh-high due to pain inferior to knee.     PT Treatment/Interventions  ADLs/Self Care Home Management;Cryotherapy;Electrical Stimulation;Moist Heat;Ultrasound;Functional mobility training;Stair training;Gait training;Therapeutic activities;Therapeutic exercise;Balance training;Neuromuscular re-education;Manual techniques;Patient/family education;Passive range of motion;Vasopneumatic Device;Taping    PT Next Visit Plan  gait training- posture, vaso, balance    PT Home Exercise Plan  SLR, hamstring stretches, QS, heel slides ( with toe taps), sit to stand.  hamstring band    Consulted and Agree with Plan of Care  Patient       Patient will benefit from skilled therapeutic intervention in order to improve the following deficits and impairments:  Abnormal gait, Postural dysfunction, Pain,  Difficulty walking, Decreased activity tolerance, Decreased range of motion, Decreased strength, Decreased balance, Obesity  Visit Diagnosis: Acute pain of right knee - Plan: PT plan of care cert/re-cert  Stiffness of right knee, not elsewhere classified - Plan: PT plan of care cert/re-cert  Muscle weakness (generalized) - Plan: PT plan of care cert/re-cert  Difficulty in walking, not elsewhere classified - Plan: PT plan of care cert/re-cert  Localized edema - Plan: PT plan of care cert/re-cert     Problem List Patient Active Problem List   Diagnosis Date Noted  . Anemia 08/05/2018  . Acute blood loss anemia 07/14/2018    Class: Acute  . Tachycardia 07/14/2018  . Diabetes mellitus with hyperglycemia ([place]) 07/14/2018  . Essential hypertension 07/14/2018  . Unilateral primary osteoarthritis, right knee 07/13/2018    Class: Chronic  . Peripheral vascular disease of extremity ([place]) 07/13/2018    Class: Chronic  . S/P TKR (total knee replacement) using cement, right 07/13/2018    Markeis Allman C. Gracen Ringwald PT, DPT 11/09/18 1:12 PM   [facility] [address] [place], [place], [address] Phone: [phone]   Fax:  [phone]  Name: ARCHANA ECKMAN MRN: [id number] Date of Birth: [birth year]

## 2018-11-11 ENCOUNTER — Ambulatory Visit: Payer: Medicare Other | Admitting: Physical Therapy

## 2018-11-11 ENCOUNTER — Encounter (INDEPENDENT_AMBULATORY_CARE_PROVIDER_SITE_OTHER): Payer: Self-pay | Admitting: Specialist

## 2018-11-11 ENCOUNTER — Other Ambulatory Visit (INDEPENDENT_AMBULATORY_CARE_PROVIDER_SITE_OTHER): Payer: Self-pay | Admitting: Specialist

## 2018-11-11 ENCOUNTER — Encounter: Payer: Self-pay | Admitting: Physical Therapy

## 2018-11-11 ENCOUNTER — Ambulatory Visit (INDEPENDENT_AMBULATORY_CARE_PROVIDER_SITE_OTHER): Payer: Medicare Other | Admitting: Specialist

## 2018-11-11 VITALS — BP 138/78 | HR 97 | Ht 64.0 in | Wt 205.0 lb

## 2018-11-11 DIAGNOSIS — R262 Difficulty in walking, not elsewhere classified: Secondary | ICD-10-CM

## 2018-11-11 DIAGNOSIS — L97311 Non-pressure chronic ulcer of right ankle limited to breakdown of skin: Secondary | ICD-10-CM

## 2018-11-11 DIAGNOSIS — M25561 Pain in right knee: Secondary | ICD-10-CM

## 2018-11-11 DIAGNOSIS — T8484XA Pain due to internal orthopedic prosthetic devices, implants and grafts, initial encounter: Secondary | ICD-10-CM

## 2018-11-11 DIAGNOSIS — Z96651 Presence of right artificial knee joint: Secondary | ICD-10-CM

## 2018-11-11 DIAGNOSIS — R600 Localized edema: Secondary | ICD-10-CM

## 2018-11-11 DIAGNOSIS — I83013 Varicose veins of right lower extremity with ulcer of ankle: Secondary | ICD-10-CM | POA: Diagnosis not present

## 2018-11-11 DIAGNOSIS — M6281 Muscle weakness (generalized): Secondary | ICD-10-CM

## 2018-11-11 DIAGNOSIS — M25661 Stiffness of right knee, not elsewhere classified: Secondary | ICD-10-CM

## 2018-11-11 MED ORDER — ONDANSETRON 4 MG PO TBDP: 4.0000 mg | ORAL_TABLET | Freq: Three times a day (TID) | ORAL | 0 refills | Status: DC | PRN

## 2018-11-11 MED ORDER — TRAMADOL HCL 50 MG PO TABS: 100.0000 mg | ORAL_TABLET | Freq: Four times a day (QID) | ORAL | 0 refills | Status: AC | PRN

## 2018-11-11 NOTE — Patient Instructions (Signed)
Plan: Continue with outpatient PT, Right knee ROM and now start PT for decreasing venous swelling right leg. Weight bear as tolerated. Probably 2 more weeks then HEP.

## 2018-11-11 NOTE — Telephone Encounter (Signed)
Patient  Called left voicemail message that the muscle relaxer has not been received by the pharmacy yet. Patient [facility] [address]   The number to contact patient is [phone]

## 2018-11-11 NOTE — Progress Notes (Signed)
   Post-Op Visit Note   Patient: Alexandra Tran           Date of Birth: 09/29/1946           MRN: [id number] Visit Date: 11/11/2018 PCP: Caprice Renshaw, MD   Assessment & Plan:4 months post Right TKR.   Chief Complaint:  Chief Complaint  Patient presents with  . Right Leg - Follow-up  Right Knee 0-100 Right knee with swelling and right LE edema.  Visit Diagnoses: No diagnosis found.  Plan: Continue with outpatient PT, Right knee ROM and now start PT for decreasing venous swelling right leg. Weight bear as tolerated. Probably 2 more weeks then HEP.   Follow-Up Instructions: No follow-ups on file.   Orders:  No orders of the defined types were placed in this encounter.  No orders of the defined types were placed in this encounter.   Imaging: No results found.  PMFS History: Patient Active Problem List   Diagnosis Date Noted  . Unilateral primary osteoarthritis, right knee 07/13/2018    Priority: High    Class: Chronic  . Acute blood loss anemia 07/14/2018    Priority: Medium    Class: Acute  . Peripheral vascular disease of extremity ([place]) 07/13/2018    Priority: Medium    Class: Chronic  . Anemia 08/05/2018  . Tachycardia 07/14/2018  . Diabetes mellitus with hyperglycemia ([place]) 07/14/2018  . Essential hypertension 07/14/2018  . S/P TKR (total knee replacement) using cement, right 07/13/2018   Past Medical History:  Diagnosis Date  . Complication of anesthesia   . Diabetes mellitus without complication ([place])   . DJD (degenerative joint disease)   . Hypertension   . PONV (postoperative nausea and vomiting)   . PVD (peripheral vascular disease) ([facility])     Family History  Problem Relation Age of Onset  . Diabetes Mother   . Prostate cancer Father   . Heart disease Brother   . Breast cancer Daughter     Past Surgical History:  Procedure Laterality Date  . ABDOMINAL HYSTERECTOMY    . BREAST EXCISIONAL BIOPSY Right    negative  . CATARACT  EXTRACTION, BILATERAL    . EYE SURGERY    . GASTRIC BYPASS  2001  . TOTAL KNEE ARTHROPLASTY Right 07/13/2018  . TOTAL KNEE ARTHROPLASTY Right 07/13/2018   Procedure: RIGHT TOTAL KNEE ARTHROPLASTY;  Surgeon: Jessy Oto, MD;  Location: [facility];  Service: Orthopedics;  Laterality: Right;  Needs RNFA   Social History   Occupational History  . Occupation: retired  Tobacco Use  . Smoking status: Never Smoker  . Smokeless tobacco: Never Used  Substance and Sexual Activity  . Alcohol use: No  . Drug use: No    Comment: uses CBD oil on her knee  . Sexual activity: Not on file

## 2018-11-11 NOTE — Therapy (Signed)
[place], [place], [address] Phone: [phone]   Fax:  [phone]  Physical Therapy Treatment  Patient Details  Name: Alexandra Tran MRN: [id number] Date of Birth: [birth year] Referring Provider (PT): Basil Dess, MD   Encounter Date: 11/11/2018  PT End of Session - 11/11/18 1114    Visit Number  6    Number of Visits  16    Date for PT Re-Evaluation  12/18/18    Authorization Type  MCR- PN at visit 10    PT Start Time  1109   pt arrived late   PT Stop Time  1200    PT Time Calculation (min)  51 min    Activity Tolerance  Patient tolerated treatment well    Behavior During Therapy  [facility] for tasks assessed/performed       Past Medical History:  Diagnosis Date  . Complication of anesthesia   . Diabetes mellitus without complication ([place])   . DJD (degenerative joint disease)   . Hypertension   . PONV (postoperative nausea and vomiting)   . PVD (peripheral vascular disease) ([place])     Past Surgical History:  Procedure Laterality Date  . ABDOMINAL HYSTERECTOMY    . BREAST EXCISIONAL BIOPSY Right    negative  . CATARACT EXTRACTION, BILATERAL    . EYE SURGERY    . GASTRIC BYPASS  2001  . TOTAL KNEE ARTHROPLASTY Right 07/13/2018  . TOTAL KNEE ARTHROPLASTY Right 07/13/2018   Procedure: RIGHT TOTAL KNEE ARTHROPLASTY;  Surgeon: Jessy Oto, MD;  Location: [facility];  Service: Orthopedics;  Laterality: Right;  Needs RNFA    There were no vitals filed for this visit.                    [place] Adult PT Treatment/Exercise - 11/11/18 0001      Knee/Hip Exercises: Aerobic   Nustep  6 min L5 LE only      Knee/Hip Exercises: Supine   Other Supine Knee/Hip Exercises  with leg elevated: toe curls, ankle pumps, quad sets, HS sets, SLR      Vasopneumatic   Number Minutes Vasopneumatic   15 minutes    Vasopnuematic Location   Knee    Vasopneumatic Pressure  Low    Vasopneumatic  Temperature   40      Manual Therapy   Manual therapy comments  edema mobilization in Rt LE               PT Short Term Goals - 11/05/18 1128      PT SHORT TERM GOAL #1   Title  Pt will be independent with her HEP.    Time  4    Period  Weeks    Status  On-going      PT SHORT TERM GOAL #2   Title  Pt will be able to perform supine to sit without having to assist her R LE.     Baseline  pt having to lift her R LE onto mat table.     Time  4    Period  Weeks    Status  On-going      PT SHORT TERM GOAL #3   Title  Pt will be able to improve 5 times sit to stand to </= 15 seconds.     Baseline  10/23/2018 = 20.36 seconds with UE support    Status  New        PT  Long Term Goals - 11/05/18 1129      PT LONG TERM GOAL #1   Title  Pt will be able to amb 500 feet with LRAD with step through gait pattern with pain in R knee </= 3/10.     Baseline  pt amb 50 feet with SBQC with pain 8/10 in R knee    Time  8    Period  Weeks    Status  New      PT LONG TERM GOAL #2   Title  Pt will improve her R knee flexion to >/= 110 degrees in order to improve functional mobility.      Baseline  AROM R knee: 8-85 degrees    Time  8    Period  Weeks      PT LONG TERM GOAL #3   Title  pt will improve her R knee flexion and extension strength to >/= 4+/5 in order to improve gait and functional mobility.     Baseline  R LE grossly 3+/5    Time  8    Period  Weeks    Status  New      PT LONG TERM GOAL #4   Title  Pt will improve R knee extension to </= 3 degrees of flexion in order to improve gait.     Baseline  AROM R knee : 8-85 degrees    Time  8    Period  Weeks    Status  New            Plan - 11/11/18 1115    Clinical Impression Statement  MD requesting treatment of edema in leg. Pt is still wearing compression stocking below knee that is creating poor edema mobilization, provided her with handout with measurements and requesting 20-7mmHg, thigh high so she knows  that to get when looking online. Exercises today created a home plan for edema management.     PT Treatment/Interventions  ADLs/Self Care Home Management;Cryotherapy;Electrical Stimulation;Moist Heat;Ultrasound;Functional mobility training;Stair training;Gait training;Therapeutic activities;Therapeutic exercise;Balance training;Neuromuscular re-education;Manual techniques;Patient/family education;Passive range of motion;Vasopneumatic Device;Taping    PT Next Visit Plan  gait training- posture, vaso, balance    PT Home Exercise Plan  SLR, hamstring stretches, QS, heel slides ( with toe taps), sit to stand.  hamstring band; elevated leg series    Consulted and Agree with Plan of Care  Patient       Patient will benefit from skilled therapeutic intervention in order to improve the following deficits and impairments:  Abnormal gait, Postural dysfunction, Pain, Difficulty walking, Decreased activity tolerance, Decreased range of motion, Decreased strength, Decreased balance, Obesity  Visit Diagnosis: Acute pain of right knee  Stiffness of right knee, not elsewhere classified  Muscle weakness (generalized)  Difficulty in walking, not elsewhere classified  Localized edema     Problem List Patient Active Problem List   Diagnosis Date Noted  . Anemia 08/05/2018  . Acute blood loss anemia 07/14/2018    Class: Acute  . Tachycardia 07/14/2018  . Diabetes mellitus with hyperglycemia ([place]) 07/14/2018  . Essential hypertension 07/14/2018  . Unilateral primary osteoarthritis, right knee 07/13/2018    Class: Chronic  . Peripheral vascular disease of extremity ([place]) 07/13/2018    Class: Chronic  . S/P TKR (total knee replacement) using cement, right 07/13/2018   Trana Ressler C. Dean Goldner PT, DPT 11/11/18 11:55 AM   [facility] [address] [place], [place], [address] Phone: [phone]   Fax:  [phone]  Name: Alexandra Tran MRN:  [id number] Date of Birth: 09/02/1946

## 2018-11-12 ENCOUNTER — Telehealth (INDEPENDENT_AMBULATORY_CARE_PROVIDER_SITE_OTHER): Payer: Self-pay | Admitting: Specialist

## 2018-11-12 NOTE — Telephone Encounter (Signed)
Patient called wanted to know if she can continue water aerobics.Patient stated she hasn't received her medication from the pharmacy. Please advise check the message from yesterday.

## 2018-11-13 MED ORDER — METHOCARBAMOL 500 MG PO TABS: 500.0000 mg | ORAL_TABLET | Freq: Four times a day (QID) | ORAL | 1 refills | Status: DC | PRN

## 2018-11-13 NOTE — Telephone Encounter (Signed)
Refill request has been sent to Dr. Nitka 

## 2018-11-13 NOTE — Telephone Encounter (Signed)
I called and lmom that per Dr. Louanne Skye she can return to wateer aerobics 1-2 times per week. And I advsied that her [facility] [address]

## 2018-11-16 ENCOUNTER — Ambulatory Visit: Payer: Medicare Other | Admitting: Physical Therapy

## 2018-11-16 ENCOUNTER — Telehealth (INDEPENDENT_AMBULATORY_CARE_PROVIDER_SITE_OTHER): Payer: Self-pay | Admitting: Radiology

## 2018-11-16 ENCOUNTER — Encounter: Payer: Self-pay | Admitting: Physical Therapy

## 2018-11-16 DIAGNOSIS — R600 Localized edema: Secondary | ICD-10-CM

## 2018-11-16 DIAGNOSIS — M25561 Pain in right knee: Secondary | ICD-10-CM

## 2018-11-16 DIAGNOSIS — R262 Difficulty in walking, not elsewhere classified: Secondary | ICD-10-CM

## 2018-11-16 DIAGNOSIS — M25661 Stiffness of right knee, not elsewhere classified: Secondary | ICD-10-CM

## 2018-11-16 DIAGNOSIS — M6281 Muscle weakness (generalized): Secondary | ICD-10-CM

## 2018-11-16 NOTE — Telephone Encounter (Signed)
Alexandra Tran is calling to get clarification on orders for leg swelling. Wants to know if we want her to get wraps for lymphedema. If so she will send order for [facility].

## 2018-11-16 NOTE — Therapy (Signed)
[place], [place], [address] Phone: [phone]   Fax:  [phone]  Physical Therapy Treatment  Patient Details  Name: PANAYIOTA LARKIN MRN: [id number] Date of Birth: 10/20/1945 Referring Provider (PT): Basil Dess, MD   Encounter Date: 11/16/2018  PT End of Session - 11/16/18 1105    Visit Number  7    Number of Visits  16    Date for PT Re-Evaluation  12/18/18    Authorization Type  MCR- PN at visit 10    PT Start Time  1105    PT Stop Time  1147    PT Time Calculation (min)  42 min    Activity Tolerance  Patient tolerated treatment well    Behavior During Therapy  [facility] for tasks assessed/performed       Past Medical History:  Diagnosis Date  . Complication of anesthesia   . Diabetes mellitus without complication ([place])   . DJD (degenerative joint disease)   . Hypertension   . PONV (postoperative nausea and vomiting)   . PVD (peripheral vascular disease) ([place])     Past Surgical History:  Procedure Laterality Date  . ABDOMINAL HYSTERECTOMY    . BREAST EXCISIONAL BIOPSY Right    negative  . CATARACT EXTRACTION, BILATERAL    . EYE SURGERY    . GASTRIC BYPASS  2001  . TOTAL KNEE ARTHROPLASTY Right 07/13/2018  . TOTAL KNEE ARTHROPLASTY Right 07/13/2018   Procedure: RIGHT TOTAL KNEE ARTHROPLASTY;  Surgeon: Jessy Oto, MD;  Location: [facility];  Service: Orthopedics;  Laterality: Right;  Needs RNFA    There were no vitals filed for this visit.  Subjective Assessment - 11/16/18 1135    Subjective  so swollen, I am having a hard time getting around. Ordered thigh high stocking but wearing knee high until then.                        [place] Adult PT Treatment/Exercise - 11/16/18 0001      Knee/Hip Exercises: Aerobic   Nustep  6.5 min L4 LE only      Vasopneumatic   Number Minutes Vasopneumatic   15 minutes    Vasopnuematic Location   Knee    Vasopneumatic Pressure  Medium     Vasopneumatic Temperature   34      Manual Therapy   Manual therapy comments  edema mobilization in Rt LE             PT Education - 11/16/18 1158    Education Details  constant motion, elevate above heart    Person(s) Educated  Patient    Methods  Explanation    Comprehension  Verbalized understanding;Need further instruction       PT Short Term Goals - 11/05/18 1128      PT SHORT TERM GOAL #1   Title  Pt will be independent with her HEP.    Time  4    Period  Weeks    Status  On-going      PT SHORT TERM GOAL #2   Title  Pt will be able to perform supine to sit without having to assist her R LE.     Baseline  pt having to lift her R LE onto mat table.     Time  4    Period  Weeks    Status  On-going      PT SHORT TERM GOAL #3  Title  Pt will be able to improve 5 times sit to stand to </= 15 seconds.     Baseline  10/23/2018 = 20.36 seconds with UE support    Status  New        PT Long Term Goals - 11/05/18 1129      PT LONG TERM GOAL #1   Title  Pt will be able to amb 500 feet with LRAD with step through gait pattern with pain in R knee </= 3/10.     Baseline  pt amb 50 feet with SBQC with pain 8/10 in R knee    Time  8    Period  Weeks    Status  New      PT LONG TERM GOAL #2   Title  Pt will improve her R knee flexion to >/= 110 degrees in order to improve functional mobility.      Baseline  AROM R knee: 8-85 degrees    Time  8    Period  Weeks      PT LONG TERM GOAL #3   Title  pt will improve her R knee flexion and extension strength to >/= 4+/5 in order to improve gait and functional mobility.     Baseline  R LE grossly 3+/5    Time  8    Period  Weeks    Status  New      PT LONG TERM GOAL #4   Title  Pt will improve R knee extension to </= 3 degrees of flexion in order to improve gait.     Baseline  AROM R knee : 8-85 degrees    Time  8    Period  Weeks    Status  New            Plan - 11/16/18 1133    Clinical Impression  Statement  Severe swelling noted in Rt LE. skin is hard with shiny appearance. Pitting edema noted from thigh to foot. Foot does not fit fully in shoe due to edema. Negative calf compression for pain, heat or redness when screening for DVT. Pt reports frequent cramping in knee. Contacted Dr Otho Ket office regarding edema. Pt reported her leg feels much better and she can move better after treatment.     PT Treatment/Interventions  ADLs/Self Care Home Management;Cryotherapy;Electrical Stimulation;Moist Heat;Ultrasound;Functional mobility training;Stair training;Gait training;Therapeutic activities;Therapeutic exercise;Balance training;Neuromuscular re-education;Manual techniques;Patient/family education;Passive range of motion;Vasopneumatic Device;Taping    PT Home Exercise Plan  SLR, hamstring stretches, QS, heel slides ( with toe taps), sit to stand.  hamstring band; elevated leg series    Consulted and Agree with Plan of Care  Patient       Patient will benefit from skilled therapeutic intervention in order to improve the following deficits and impairments:  Abnormal gait, Postural dysfunction, Pain, Difficulty walking, Decreased activity tolerance, Decreased range of motion, Decreased strength, Decreased balance, Obesity  Visit Diagnosis: Acute pain of right knee  Stiffness of right knee, not elsewhere classified  Muscle weakness (generalized)  Difficulty in walking, not elsewhere classified  Localized edema     Problem List Patient Active Problem List   Diagnosis Date Noted  . Anemia 08/05/2018  . Acute blood loss anemia 07/14/2018    Class: Acute  . Tachycardia 07/14/2018  . Diabetes mellitus with hyperglycemia ([place]) 07/14/2018  . Essential hypertension 07/14/2018  . Unilateral primary osteoarthritis, right knee 07/13/2018    Class: Chronic  . Peripheral vascular disease of extremity ([place]) 07/13/2018  Class: Chronic  . S/P TKR (total knee replacement) using cement, right  07/13/2018   Kamorah Nevils C. Abcde Oneil PT, DPT 11/16/18 12:01 PM   [facility] [address] [place], [place], [address] Phone: [phone]   Fax:  [phone]  Name: DANDRIA GRIEGO MRN: [id number] Date of Birth: Feb 16, 1946

## 2018-11-18 ENCOUNTER — Encounter: Payer: Self-pay | Admitting: Physical Therapy

## 2018-11-18 ENCOUNTER — Ambulatory Visit: Payer: Medicare Other | Admitting: Physical Therapy

## 2018-11-18 ENCOUNTER — Telehealth (INDEPENDENT_AMBULATORY_CARE_PROVIDER_SITE_OTHER): Payer: Self-pay | Admitting: Specialist

## 2018-11-18 DIAGNOSIS — R262 Difficulty in walking, not elsewhere classified: Secondary | ICD-10-CM

## 2018-11-18 DIAGNOSIS — M25561 Pain in right knee: Secondary | ICD-10-CM

## 2018-11-18 DIAGNOSIS — M25661 Stiffness of right knee, not elsewhere classified: Secondary | ICD-10-CM

## 2018-11-18 DIAGNOSIS — M6281 Muscle weakness (generalized): Secondary | ICD-10-CM

## 2018-11-18 DIAGNOSIS — R600 Localized edema: Secondary | ICD-10-CM

## 2018-11-18 NOTE — Therapy (Signed)
[place], [place], [address] Phone: [phone]   Fax:  [phone]  Physical Therapy Treatment  Patient Details  Name: Alexandra Tran MRN: [id number] Date of Birth: Feb 05, 1946 Referring Provider (PT): Basil Dess, MD   Encounter Date: 11/18/2018  PT End of Session - 11/18/18 1111    Visit Number  8    Number of Visits  16    Date for PT Re-Evaluation  12/18/18    Authorization Type  MCR- PN at visit 10    PT Start Time  1107    PT Stop Time  1157    PT Time Calculation (min)  50 min    Activity Tolerance  Patient tolerated treatment well    Behavior During Therapy  [facility] for tasks assessed/performed       Past Medical History:  Diagnosis Date  . Complication of anesthesia   . Diabetes mellitus without complication ([place])   . DJD (degenerative joint disease)   . Hypertension   . PONV (postoperative nausea and vomiting)   . PVD (peripheral vascular disease) ([place])     Past Surgical History:  Procedure Laterality Date  . ABDOMINAL HYSTERECTOMY    . BREAST EXCISIONAL BIOPSY Right    negative  . CATARACT EXTRACTION, BILATERAL    . EYE SURGERY    . GASTRIC BYPASS  2001  . TOTAL KNEE ARTHROPLASTY Right 07/13/2018  . TOTAL KNEE ARTHROPLASTY Right 07/13/2018   Procedure: RIGHT TOTAL KNEE ARTHROPLASTY;  Surgeon: Jessy Oto, MD;  Location: [place];  Service: Orthopedics;  Laterality: Right;  Needs RNFA    There were no vitals filed for this visit.  Subjective Assessment - 11/18/18 1112    Subjective  able to wear different shoes since toe nail is feeling better. frustrated with billing by [facility]. my leg is still so swollen, thihg high stocking should come today. has not seen a vascular surgeon lately. random "squiggly" pain around knee that is about 5/10 when it happens. nagging cramp but it feels better when I walk and do the exercises.     Patient Stated Goals  walk without hurting    Currently  in Pain?  No/denies    Pain Location  Knee                       [facility] Adult PT Treatment/Exercise - 11/18/18 0001      Knee/Hip Exercises: Aerobic   Nustep  5 min L5 LE only      Knee/Hip Exercises: Standing   Hip Abduction  Both;20 reps    Other Standing Knee Exercises  slow, alternating marching single hand on chair      Knee/Hip Exercises: Seated   Long Arc Quad  Right;15 reps    Long Arc Quad Weight  3 lbs.      Vasopneumatic   Number Minutes Vasopneumatic   10 minutes    Vasopnuematic Location   Knee    Vasopneumatic Pressure  Medium    Vasopneumatic Temperature   34             PT Education - 11/18/18 1311    Education Details  short term goals, rationale for exercises, SBA around clinic and <>waiting room with discussion regarding leg    Person(s) Educated  Patient    Methods  Explanation;Demonstration;Tactile cues;Verbal cues    Comprehension  Verbalized understanding;Need further instruction;Returned demonstration;Verbal cues required;Tactile cues required  PT Short Term Goals - 11/18/18 1115      PT SHORT TERM GOAL #1   Title  Pt will be independent with her HEP.    Status  Achieved    Target Date  11/13/18      PT SHORT TERM GOAL #2   Title  Pt will be able to perform supine to sit without having to assist her R LE.     Status  Achieved    Target Date  11/20/18      PT SHORT TERM GOAL #3   Title  Pt will be able to improve 5 times sit to stand to </= 15 seconds.     Baseline  2/19= 38s with bil UE    Status  Not Met        PT Long Term Goals - 11/05/18 1129      PT LONG TERM GOAL #1   Title  Pt will be able to amb 500 feet with LRAD with step through gait pattern with pain in R knee </= 3/10.     Baseline  pt amb 50 feet with SBQC with pain 8/10 in R knee    Time  8    Period  Weeks    Status  New      PT LONG TERM GOAL #2   Title  Pt will improve her R knee flexion to >/= 110 degrees in order to improve functional  mobility.      Baseline  AROM R knee: 8-85 degrees    Time  8    Period  Weeks      PT LONG TERM GOAL #3   Title  pt will improve her R knee flexion and extension strength to >/= 4+/5 in order to improve gait and functional mobility.     Baseline  R LE grossly 3+/5    Time  8    Period  Weeks    Status  New      PT LONG TERM GOAL #4   Title  Pt will improve R knee extension to </= 3 degrees of flexion in order to improve gait.     Baseline  AROM R knee : 8-85 degrees    Time  8    Period  Weeks    Status  New            Plan - 11/18/18 1134    Clinical Impression Statement  Pt reports stiffness in her body and was encouraged to move frequently. Says 2 times she has had chest pain and point just inferior to sternum. Advised that if she is ever concerned about her heart, please go to ED. Pt cont to be concerned about swelling and has not seen vascular MD in a few years. PT SBA during gait for safety but pt is slow and safe- unable to place all 4 points of cane on floor. Significant decrease in 5 times sit<>stand noted today.     PT Treatment/Interventions  ADLs/Self Care Home Management;Cryotherapy;Electrical Stimulation;Moist Heat;Ultrasound;Functional mobility training;Stair training;Gait training;Therapeutic activities;Therapeutic exercise;Balance training;Neuromuscular re-education;Manual techniques;Patient/family education;Passive range of motion;Vasopneumatic Device;Taping    PT Next Visit Plan  gait training- posture, vaso, balance    PT Home Exercise Plan  SLR, hamstring stretches, QS, heel slides ( with toe taps), sit to stand.  hamstring band; elevated leg series    Consulted and Agree with Plan of Care  Patient       Patient will benefit from skilled therapeutic intervention in  order to improve the following deficits and impairments:  Abnormal gait, Postural dysfunction, Pain, Difficulty walking, Decreased activity tolerance, Decreased range of motion, Decreased strength,  Decreased balance, Obesity  Visit Diagnosis: Acute pain of right knee  Stiffness of right knee, not elsewhere classified  Muscle weakness (generalized)  Difficulty in walking, not elsewhere classified  Localized edema     Problem List Patient Active Problem List   Diagnosis Date Noted  . Anemia 08/05/2018  . Acute blood loss anemia 07/14/2018    Class: Acute  . Tachycardia 07/14/2018  . Diabetes mellitus with hyperglycemia ([place]) 07/14/2018  . Essential hypertension 07/14/2018  . Unilateral primary osteoarthritis, right knee 07/13/2018    Class: Chronic  . Peripheral vascular disease of extremity ([place]) 07/13/2018    Class: Chronic  . S/P TKR (total knee replacement) using cement, right 07/13/2018    Ecko Beasley C. Tandi Hanko PT, DPT 11/18/18 1:12 PM   [facility] [address] [place], [place], [address] Phone: [phone]   Fax:  [phone]  Name: PIPER HASSEBROCK MRN: [id number] Date of Birth: 05/19/1946

## 2018-11-18 NOTE — Telephone Encounter (Signed)
lmom for Janett Billow to call me back

## 2018-11-18 NOTE — Telephone Encounter (Signed)
Patient called asked if Dr Louanne Skye can get her some help for her legs. Patient said the (PT) do not have the machine to help her with the edena in her legs. The number to contact patient is [phone]

## 2018-11-19 ENCOUNTER — Other Ambulatory Visit (INDEPENDENT_AMBULATORY_CARE_PROVIDER_SITE_OTHER): Payer: Self-pay | Admitting: Radiology

## 2018-11-19 DIAGNOSIS — L97311 Non-pressure chronic ulcer of right ankle limited to breakdown of skin: Principal | ICD-10-CM

## 2018-11-19 DIAGNOSIS — I83013 Varicose veins of right lower extremity with ulcer of ankle: Secondary | ICD-10-CM

## 2018-11-19 NOTE — Telephone Encounter (Signed)
I put order in for [facility] referral per Dr. Louanne Skye, I called and lmom for pt that this was done

## 2018-11-19 NOTE — Telephone Encounter (Signed)
Alexandra Tran,  Dr. Louanne Skye wants the leg machine that they use in the hospital to put compression on her that squeezes them,  Its the sequentiel compression device for the legs. Please let me know if you are able to this or not.

## 2018-11-23 ENCOUNTER — Ambulatory Visit: Payer: Medicare Other | Admitting: Physical Therapy

## 2018-11-23 ENCOUNTER — Encounter: Payer: Self-pay | Admitting: Physical Therapy

## 2018-11-23 DIAGNOSIS — M25561 Pain in right knee: Secondary | ICD-10-CM

## 2018-11-23 DIAGNOSIS — M25661 Stiffness of right knee, not elsewhere classified: Secondary | ICD-10-CM

## 2018-11-23 DIAGNOSIS — R600 Localized edema: Secondary | ICD-10-CM

## 2018-11-23 DIAGNOSIS — M6281 Muscle weakness (generalized): Secondary | ICD-10-CM

## 2018-11-23 DIAGNOSIS — R262 Difficulty in walking, not elsewhere classified: Secondary | ICD-10-CM

## 2018-11-23 NOTE — Therapy (Signed)
[place], [place], [address] Phone: [phone]   Fax:  [phone]  Physical Therapy Treatment  Patient Details  Name: Alexandra Tran MRN: [id number] Date of Birth: 1945-11-18 Referring Provider (PT): Basil Dess, MD   Encounter Date: 11/23/2018  PT End of Session - 11/23/18 1625    Visit Number  9    Number of Visits  16    Date for PT Re-Evaluation  12/18/18    Authorization Type  MCR- PN at visit 10    PT Start Time  1545    PT Stop Time  1630    PT Time Calculation (min)  45 min    Activity Tolerance  Patient tolerated treatment well    Behavior During Therapy  [facility] for tasks assessed/performed;Agitated       Past Medical History:  Diagnosis Date  . Complication of anesthesia   . Diabetes mellitus without complication ([place])   . DJD (degenerative joint disease)   . Hypertension   . PONV (postoperative nausea and vomiting)   . PVD (peripheral vascular disease) ([place])     Past Surgical History:  Procedure Laterality Date  . ABDOMINAL HYSTERECTOMY    . BREAST EXCISIONAL BIOPSY Right    negative  . CATARACT EXTRACTION, BILATERAL    . EYE SURGERY    . GASTRIC BYPASS  2001  . TOTAL KNEE ARTHROPLASTY Right 07/13/2018  . TOTAL KNEE ARTHROPLASTY Right 07/13/2018   Procedure: RIGHT TOTAL KNEE ARTHROPLASTY;  Surgeon: Jessy Oto, MD;  Location: [place] [facility];  Service: Orthopedics;  Laterality: Right;  Needs RNFA    There were no vitals filed for this visit.  Subjective Assessment - 11/23/18 1600    Subjective  I get a cramp in my knee when I get up from sitting. Points to anterior-medial knee. They said they were sending me to vascular but I have not heard from them. I try to do the exercises and massage.     Patient Stated Goals  walk without hurting    Currently in Pain?  Yes    Pain Location  Knee    Pain Orientation  Right    Pain Descriptors / Indicators  Cramping                        [facility] Adult PT Treatment/Exercise - 11/23/18 0001      Knee/Hip Exercises: Aerobic   Nustep  7 min L5 LE only      Vasopneumatic   Number Minutes Vasopneumatic   10 minutes   cannot tolerate longer   Vasopnuematic Location   Knee    Vasopneumatic Pressure  Medium    Vasopneumatic Temperature   34      Manual Therapy   Manual therapy comments  edema mobilization foot to hip               PT Short Term Goals - 11/18/18 1115      PT SHORT TERM GOAL #1   Title  Pt will be independent with her HEP.    Status  Achieved    Target Date  11/13/18      PT SHORT TERM GOAL #2   Title  Pt will be able to perform supine to sit without having to assist her R LE.     Status  Achieved    Target Date  11/20/18      PT SHORT TERM GOAL #3   Title  Pt will be able to improve 5 times sit to stand to </= 15 seconds.     Baseline  2/19= 38s with bil UE    Status  Not Met        PT Long Term Goals - 11/05/18 1129      PT LONG TERM GOAL #1   Title  Pt will be able to amb 500 feet with LRAD with step through gait pattern with pain in R knee </= 3/10.     Baseline  pt amb 50 feet with SBQC with pain 8/10 in R knee    Time  8    Period  Weeks    Status  New      PT LONG TERM GOAL #2   Title  Pt will improve her R knee flexion to >/= 110 degrees in order to improve functional mobility.      Baseline  AROM R knee: 8-85 degrees    Time  8    Period  Weeks      PT LONG TERM GOAL #3   Title  pt will improve her R knee flexion and extension strength to >/= 4+/5 in order to improve gait and functional mobility.     Baseline  R LE grossly 3+/5    Time  8    Period  Weeks    Status  New      PT LONG TERM GOAL #4   Title  Pt will improve R knee extension to </= 3 degrees of flexion in order to improve gait.     Baseline  AROM R knee : 8-85 degrees    Time  8    Period  Weeks    Status  New            Plan - 11/23/18 1626    Clinical  Impression Statement  Pt is very frustrated by her leg. She reports she is not taking her medications as prescribed or watching her diet. She says she is being asked to do too much by icing and exercising and soaking her toe. Was referred to vascular but does not know who. Pt demo improved ambulation pattern and speed today but feels limited by "cramping" in knee.     PT Treatment/Interventions  ADLs/Self Care Home Management;Cryotherapy;Electrical Stimulation;Moist Heat;Ultrasound;Functional mobility training;Stair training;Gait training;Therapeutic activities;Therapeutic exercise;Balance training;Neuromuscular re-education;Manual techniques;Patient/family education;Passive range of motion;Vasopneumatic Device;Taping    PT Next Visit Plan  edema mgmt per MD    PN    PT Home Exercise Plan  SLR, hamstring stretches, QS, heel slides ( with toe taps), sit to stand.  hamstring band; elevated leg series    Consulted and Agree with Plan of Care  Patient       Patient will benefit from skilled therapeutic intervention in order to improve the following deficits and impairments:  Abnormal gait, Postural dysfunction, Pain, Difficulty walking, Decreased activity tolerance, Decreased range of motion, Decreased strength, Decreased balance, Obesity  Visit Diagnosis: Acute pain of right knee  Stiffness of right knee, not elsewhere classified  Muscle weakness (generalized)  Difficulty in walking, not elsewhere classified  Localized edema     Problem List Patient Active Problem List   Diagnosis Date Noted  . Anemia 08/05/2018  . Acute blood loss anemia 07/14/2018    Class: Acute  . Tachycardia 07/14/2018  . Diabetes mellitus with hyperglycemia ([place]) 07/14/2018  . Essential hypertension 07/14/2018  . Unilateral primary osteoarthritis, right knee 07/13/2018    Class: Chronic  .  Peripheral vascular disease of extremity ([place]) 07/13/2018    Class: Chronic  . S/P TKR (total knee replacement) using  cement, right 07/13/2018   Logyn Kendrick C. Rebeca Valdivia PT, DPT 11/23/18 4:33 PM   [facility] [address] [place], [place], [address] Phone: [phone]   Fax:  [phone]  Name: Alexandra Tran MRN: [id number] Date of Birth: 1946-07-25

## 2018-11-25 ENCOUNTER — Encounter: Payer: Self-pay | Admitting: Physical Therapy

## 2018-11-25 ENCOUNTER — Ambulatory Visit: Payer: Medicare Other | Admitting: Physical Therapy

## 2018-11-25 DIAGNOSIS — M25661 Stiffness of right knee, not elsewhere classified: Secondary | ICD-10-CM

## 2018-11-25 DIAGNOSIS — M25561 Pain in right knee: Secondary | ICD-10-CM | POA: Diagnosis not present

## 2018-11-25 DIAGNOSIS — M6281 Muscle weakness (generalized): Secondary | ICD-10-CM

## 2018-11-25 DIAGNOSIS — R262 Difficulty in walking, not elsewhere classified: Secondary | ICD-10-CM

## 2018-11-25 DIAGNOSIS — R600 Localized edema: Secondary | ICD-10-CM

## 2018-11-25 NOTE — Therapy (Addendum)
[place], [place], [address] Phone: [phone]   Fax:  [phone]  Physical Therapy Treatment/Discharge Progress Note Reporting Period 10/23/2018 to 11/25/2018  See note below for Objective Data and Assessment of Progress/Goals.       Patient Details  Name: Alexandra Tran MRN: [id number] Date of Birth: Sep 20, 1946 Referring Provider (PT): Basil Dess, MD   Encounter Date: 11/25/2018  PT End of Session - 11/25/18 1108    Visit Number  10    Number of Visits  16    Date for PT Re-Evaluation  12/18/18    Authorization Type  MCR- PN at visit 10    PT Start Time  1106    PT Stop Time  1148    PT Time Calculation (min)  42 min    Activity Tolerance  Patient tolerated treatment well    Behavior During Therapy  [facility] for tasks assessed/performed       Past Medical History:  Diagnosis Date  . Complication of anesthesia   . Diabetes mellitus without complication ([place])   . DJD (degenerative joint disease)   . Hypertension   . PONV (postoperative nausea and vomiting)   . PVD (peripheral vascular disease) ([place])     Past Surgical History:  Procedure Laterality Date  . ABDOMINAL HYSTERECTOMY    . BREAST EXCISIONAL BIOPSY Right    negative  . CATARACT EXTRACTION, BILATERAL    . EYE SURGERY    . GASTRIC BYPASS  2001  . TOTAL KNEE ARTHROPLASTY Right 07/13/2018  . TOTAL KNEE ARTHROPLASTY Right 07/13/2018   Procedure: RIGHT TOTAL KNEE ARTHROPLASTY;  Surgeon: Jessy Oto, MD;  Location: [facility];  Service: Orthopedics;  Laterality: Right;  Needs RNFA    There were no vitals filed for this visit.  Subjective Assessment - 11/25/18 1108    Subjective  It was bothering me but I am not pressing on it right now. I took a pill not long ago.     Patient Stated Goals  walk without hurting    Currently in Pain?  No/denies         [facility] PT Assessment - 11/25/18 0001      Assessment   Medical Diagnosis   R TKR    Referring Provider (PT)  Basil Dess, MD    Onset Date/Surgical Date  07/13/18    Hand Dominance  Right      Observation/Other Assessments-Edema    Edema  Circumferential      Circumferential Edema   Circumferential - Right  49    Circumferential - Left   43.5      AROM   Right Knee Extension  8    Right Knee Flexion  90      Strength   Right Knee Flexion  4+/5    Right Knee Extension  4+/5                   [facility] Adult PT Treatment/Exercise - 11/25/18 0001      Knee/Hip Exercises: Aerobic   Nustep  6 min L4 LE only      Knee/Hip Exercises: Standing   Heel Raises  20 reps    Hip Abduction  Both;20 reps      Knee/Hip Exercises: Seated   Long Arc Quad  Right;15 reps    Long Arc Quad Limitations  ball bw knees      Manual Therapy   Manual therapy comments  edema mobilization foot  to hip with legs elevated             PT Education - 11/25/18 1717    Education Details  goals, exercise form/rationale    Person(s) Educated  Patient    Methods  Explanation;Demonstration;Tactile cues;Verbal cues    Comprehension  Verbalized understanding;Need further instruction;Returned demonstration;Verbal cues required;Tactile cues required       PT Short Term Goals - 11/18/18 1115      PT SHORT TERM GOAL #1   Title  Pt will be independent with her HEP.    Status  Achieved    Target Date  11/13/18      PT SHORT TERM GOAL #2   Title  Pt will be able to perform supine to sit without having to assist her R LE.     Status  Achieved    Target Date  11/20/18      PT SHORT TERM GOAL #3   Title  Pt will be able to improve 5 times sit to stand to </= 15 seconds.     Baseline  2/19= 38s with bil UE    Status  Not Met        PT Long Term Goals - 11/25/18 1144      PT LONG TERM GOAL #1   Title  Pt will be able to amb 500 feet with LRAD with step through gait pattern with pain in R knee </= 3/10.     Baseline  quad cane ambulateion 6/10 when I first  start but it decreases as I go    Status  Not Met      PT LONG TERM GOAL #2   Title  Pt will improve her R knee flexion to >/= 110 degrees in order to improve functional mobility.      Baseline  90    Status  Not Met      PT LONG TERM GOAL #3   Title  pt will improve her R knee flexion and extension strength to >/= 4+/5 in order to improve gait and functional mobility.     Status  Achieved      PT LONG TERM GOAL #4   Title  Pt will improve R knee extension to </= 3 degrees of flexion in order to improve gait.     Baseline  8    Status  Not Met            Plan - 11/25/18 1310    Clinical Impression Statement  Pt tolerated exercises well but continues to have edema. Has a referral to see vascular and pt would like to continue PT until that appointment.     PT Treatment/Interventions  ADLs/Self Care Home Management;Cryotherapy;Electrical Stimulation;Moist Heat;Ultrasound;Functional mobility training;Stair training;Gait training;Therapeutic activities;Therapeutic exercise;Balance training;Neuromuscular re-education;Manual techniques;Patient/family education;Passive range of motion;Vasopneumatic Device;Taping    PT Next Visit Plan  edema mgmt per MD, gross LE strengthening & flexibility    PT Home Exercise Plan  SLR, hamstring stretches, QS, heel slides ( with toe taps), sit to stand.  hamstring band; elevated leg series    Consulted and Agree with Plan of Care  Patient       Patient will benefit from skilled therapeutic intervention in order to improve the following deficits and impairments:  Abnormal gait, Postural dysfunction, Pain, Difficulty walking, Decreased activity tolerance, Decreased range of motion, Decreased strength, Decreased balance, Obesity  Visit Diagnosis: Acute pain of right knee  Stiffness of right knee, not elsewhere classified  Muscle weakness (  generalized)  Difficulty in walking, not elsewhere classified  Localized edema     Problem List Patient  Active Problem List   Diagnosis Date Noted  . Anemia 08/05/2018  . Acute blood loss anemia 07/14/2018    Class: Acute  . Tachycardia 07/14/2018  . Diabetes mellitus with hyperglycemia ([place]) 07/14/2018  . Essential hypertension 07/14/2018  . Unilateral primary osteoarthritis, right knee 07/13/2018    Class: Chronic  . Peripheral vascular disease of extremity ([place]) 07/13/2018    Class: Chronic  . S/P TKR (total knee replacement) using cement, right 07/13/2018    Kasey Hansell C. Tuck Dulworth PT, DPT 11/25/18 5:18 PM   [facility] [address] [place], [place], [address] Phone: [phone]   Fax:  [phone]  Name: LUCEE BRISSETT MRN: [id number] Date of Birth: 11/08/1945  Exercises emailed to pt on 01/20/2019:  Access Code: [email or web address]  URL: [email or web address]  Date: 01/20/2019  Prepared by: Selinda Eon   Exercises  Long Sitting Quad Set - 10 reps - 1 sets - 5s hold - 1x daily - 7x weekly  Active Straight Leg Raise with Quad Set - 15 reps - 2 sets - 1x daily - 7x weekly  Seated Long Arc Quad - 15 reps - 2 sets - 3s hold - 1x daily - 7x weekly  Sidelying Hip Abduction - 20 reps - 2 sets - 1x daily - 7x weekly  Heel rises with counter support - 20 reps - 2 sets - 1x daily - 7x weekly  Standing Knee Flexion Strengthening at Chair - 10 reps - 3 sets - 1x daily - 7x weekly  Wall Quarter Squat - 10 reps - 2 sets - 5s hold - 1x daily - 7x weekly  Seated Hamstring Stretch with Strap - 2 reps - 1 sets - 30s hold - 1x daily - 7x weekly  Gastroc Stretch on Wall - 2 reps - 1 sets - 30s hold - 1x daily - 7x weekly  Supine Heel Slide with Strap - 10 reps - 1 sets - 10s hold - 1x daily - 7x weekly   Roni Scow C. Taysha Majewski PT, DPT 01/20/19 2:06 PM  PHYSICAL THERAPY DISCHARGE SUMMARY  Visits from Start of Care: 10  Current functional level related to goals / functional outcomes: See above   Remaining deficits: See  above   Education / Equipment: Anatomy of condition, POC, HEP, exercise form/rationale  Plan: Patient agrees to discharge.  Patient goals were partially met. Patient is being discharged due to not returning since the last visit.  ?????    HEP sent to pt for independent strengthening.  Aikam Hellickson C. Brittne Kawasaki PT, DPT 05/26/19 4:51 PM

## 2018-12-07 ENCOUNTER — Encounter: Payer: Medicare Other | Admitting: Physical Therapy

## 2018-12-09 ENCOUNTER — Encounter: Payer: Medicare Other | Admitting: Physical Therapy

## 2018-12-11 ENCOUNTER — Other Ambulatory Visit: Payer: Self-pay

## 2018-12-11 DIAGNOSIS — R609 Edema, unspecified: Secondary | ICD-10-CM

## 2018-12-15 ENCOUNTER — Other Ambulatory Visit: Payer: Self-pay

## 2018-12-15 ENCOUNTER — Ambulatory Visit (INDEPENDENT_AMBULATORY_CARE_PROVIDER_SITE_OTHER): Payer: Medicare Other | Admitting: Vascular Surgery

## 2018-12-15 ENCOUNTER — Ambulatory Visit (HOSPITAL_COMMUNITY)
Admission: RE | Admit: 2018-12-15 | Discharge: 2018-12-15 | Disposition: A | Discharge status: Home / Self Care | Payer: Medicare Other | Source: Ambulatory Visit | Attending: Vascular Surgery | Admitting: Vascular Surgery

## 2018-12-15 ENCOUNTER — Encounter: Payer: Self-pay | Admitting: Vascular Surgery

## 2018-12-15 VITALS — BP 112/68 | HR 84 | Temp 98.0°F | Resp 16 | Ht 64.0 in

## 2018-12-15 DIAGNOSIS — R609 Edema, unspecified: Secondary | ICD-10-CM

## 2018-12-15 NOTE — Progress Notes (Signed)
[facility] [place]  Patient name: Alexandra Tran MRN: [id number] DOB: 1945/12/10 Sex: female  REASON FOR CONSULT: Evaluate leg swelling right lower extremity  HPI: Alexandra Tran is a 73 y.o. female, who is here today for evaluation of swelling in right lower extremity.  She has multiple components of difficulty in her lower extremities.  She has had her total knee replacement and is making recovery from this.  She reports that she does have a history of peripheral vascular occlusive disease.  She is recently undergone treatment for an ingrown toenail in her right great toe.  She is concerned regarding swelling in her legs right greater than left so reports aching and painful sensation.  She has no history of lower extremity tissue loss.  Past Medical History:  Diagnosis Date  . Complication of anesthesia   . Diabetes mellitus without complication ([place])   . DJD (degenerative joint disease)   . Hypertension   . PONV (postoperative nausea and vomiting)   . PVD (peripheral vascular disease) ([facility])     Family History  Problem Relation Age of Onset  . Diabetes Mother   . Prostate cancer Father   . Heart disease Brother   . Breast cancer Daughter     SOCIAL HISTORY: Social History   Socioeconomic History  . Marital status: Widowed    Spouse name: Not on file  . Number of children: Not on file  . Years of education: Not on file  . Highest education level: Not on file  Occupational History  . Occupation: retired  Scientific laboratory technician  . Financial resource strain: Not on file  . Food insecurity:    Worry: Not on file    Inability: Not on file  . Transportation needs:    Medical: Not on file    Non-medical: Not on file  Tobacco Use  . Smoking status: Never Smoker  . Smokeless tobacco: Never Used  Substance and Sexual Activity  . Alcohol use: No  . Drug use: No    Comment: uses CBD oil on her knee  . Sexual activity:  Not on file  Lifestyle  . Physical activity:    Days per week: Not on file    Minutes per session: Not on file  . Stress: Not on file  Relationships  . Social connections:    Talks on phone: Not on file    Gets together: Not on file    Attends religious service: Not on file    Active member of club or organization: Not on file    Attends meetings of clubs or organizations: Not on file    Relationship status: Not on file  . Intimate partner violence:    Fear of current or ex partner: Not on file    Emotionally abused: Not on file    Physically abused: Not on file    Forced sexual activity: Not on file  Other Topics Concern  . Not on file  Social History Narrative  . Not on file    Allergies  Allergen Reactions  . Hydrocodone-Acetaminophen Nausea And Vomiting  . Nsaids Other (See Comments)    Has had gastric surgery    Current Outpatient Medications  Medication Sig Dispense Refill  . albuterol (PROVENTIL HFA;VENTOLIN HFA) 108 (90 Base) MCG/ACT inhaler Inhale 1-2 puffs into the lungs every 4 (four) hours as needed for wheezing or shortness of breath.     Marland Kitchen aspirin 81 MG EC tablet Take 1 tablet (  81 mg total) by mouth 2 (two) times daily after a meal. 30 tablet 1  . baclofen (LIORESAL) 10 MG tablet Take 1 tablet (10 mg total) by mouth 3 (three) times daily. 30 each 0  . cilostazol (PLETAL) 100 MG tablet Take 100 mg by mouth 2 (two) times daily.     . diclofenac sodium (VOLTAREN) 1 % GEL Apply 1 application topically 3 (three) times daily as needed (pain).    . dorzolamide-timolol (COSOPT) 22.3-6.8 MG/ML ophthalmic solution Place 1 drop into both eyes 2 (two) times daily.    . furosemide (LASIX) 80 MG tablet Take 40 mg by mouth daily as needed for fluid.     Marland Kitchen gabapentin (NEURONTIN) 100 MG capsule Take 100 mg by mouth at bedtime.     . hydrochlorothiazide (HYDRODIURIL) 12.5 MG tablet Take 1 tablet by mouth daily.  3  . latanoprost (XALATAN) 0.005 % ophthalmic solution Place 1  drop into both eyes every morning.    . linaclotide (LINZESS) 290 MCG CAPS capsule Take 290 mcg by mouth daily before breakfast.    . losartan (COZAAR) 100 MG tablet Take 100 mg by mouth daily.  11  . metformin (FORTAMET) 1000 MG (OSM) 24 hr tablet Take 1,000 mg by mouth every morning.  10  . methocarbamol (ROBAXIN) 500 MG tablet Take 1 tablet (500 mg total) by mouth every 6 (six) hours as needed for muscle spasms. 40 tablet 1  . metoprolol succinate (TOPROL-XL) 50 MG 24 hr tablet Take 1 tablet (50 mg total) by mouth daily.    . montelukast (SINGULAIR) 10 MG tablet Take 10 mg by mouth every morning.    . Multiple Vitamins-Minerals (MULTIVITAMIN WITH MINERALS) tablet Take 1 tablet by mouth daily.    . ondansetron (ZOFRAN-ODT) 4 MG disintegrating tablet Take 1 tablet (4 mg total) by mouth every 8 (eight) hours as needed for nausea or vomiting. 20 tablet 0  . oxybutynin (DITROPAN) 5 MG tablet Take 5 mg by mouth daily.  1  . OZEMPIC, 0.25 OR 0.5 MG/DOSE, 2 MG/1.5ML SOPN Inject 50 Units into the muscle once a week. Wednesday    . pantoprazole (PROTONIX) 40 MG tablet Take 40 mg by mouth daily.    . phentermine (ADIPEX-P) 37.5 MG tablet Take 1 tablet by mouth daily before breakfast.   2  . potassium chloride SA (K-DUR,KLOR-CON) 20 MEQ tablet Take 20 mEq by mouth 2 (two) times daily. *DISSOLVABLE    . senna-docusate (SENOKOT-S) 8.6-50 MG tablet Take 1 tablet by mouth 2 (two) times daily.    Tyler Aas FLEXTOUCH 200 UNIT/ML SOPN Inject 25 Units into the muscle at bedtime.   10  . Vitamin D, Ergocalciferol, (DRISDOL) 50000 units CAPS capsule Take 50,000 Units by mouth every 7 (seven) days. Tuesday    . HYDROcodone-acetaminophen (NORCO) 7.5-325 MG tablet Take 1-2 tablets by mouth every 4 (four) hours as needed for severe pain (pain score 7-10). (Patient not taking: Reported on 12/15/2018) 40 tablet 0   No current facility-administered medications for this visit.     REVIEW OF SYSTEMS:  [X]  denotes positive  finding, [ ]  denotes negative finding Cardiac  Comments:  Chest pain or chest pressure:    Shortness of breath upon exertion:    Short of breath when lying flat:    Irregular heart rhythm:        Vascular    Pain in calf, thigh, or hip brought on by ambulation:    Pain in feet at night that  wakes you up from your sleep:  x   Blood clot in your veins:    Leg swelling:  x       Pulmonary    Oxygen at home:    Productive cough:     Wheezing:  x       Neurologic    Sudden weakness in arms or legs:     Sudden numbness in arms or legs:     Sudden onset of difficulty speaking or slurred speech:    Temporary loss of vision in one eye:     Problems with dizziness:         Gastrointestinal    Blood in stool:     Vomited blood:         Genitourinary    Burning when urinating:     Blood in urine:        Psychiatric    Major depression:         Hematologic    Bleeding problems:    Problems with blood clotting too easily:        Skin    Rashes or ulcers:        Constitutional    Fever or chills:      PHYSICAL EXAM: Vitals:   12/15/18 1441  BP: 112/68  Pulse: 84  Resp: 16  Temp: 98 F (36.7 C)  SpO2: 99%  Height: 5\' 4"  (1.626 m)    GENERAL: The patient is a well-nourished female, in no acute distress. The vital signs are documented above. CARDIOVASCULAR: 2+ radial pulses bilaterally.  Pitting edema bilaterally.  I do not palpate pedal pulses.  No evidence of varicosities or venous stasis changes PULMONARY: There is good air exchange  ABDOMEN: Soft and non-tender  MUSCULOSKELETAL: There are no major deformities or cyanosis. NEUROLOGIC: No focal weakness or paresthesias are detected. SKIN: There are no ulcers or rashes noted. PSYCHIATRIC: The patient has a normal affect.  DATA:  Noninvasive studies from today of her right leg reveal no evidence of DVT.  She does have reflux at the saphenofemoral junction.  Her saphenous vein is not dilated.  She had undergone  lower extremity arterial studies in September 2019 and I reviewed these with her as well.  This reveals biphasic waveforms at the anterior tibial and posterior tibial bilaterally with no evidence of significant arterial disease.  She does have some calcification making ankle arm indices unreliable    MEDICAL ISSUES: Discussed these findings with the patient.  I do not see any evidence of significant venous or arterial pathology.  She is relatively compliant with knee-high graduated compressions currently and I have encouraged her to continue with this.  She was reassured with this discussion will see [place] again on an as-needed basis   Rosetta Posner, MD [facility] Special[facility] Tel [phone] Pager [phone]

## 2018-12-16 ENCOUNTER — Telehealth (INDEPENDENT_AMBULATORY_CARE_PROVIDER_SITE_OTHER): Payer: Self-pay | Admitting: Specialist

## 2018-12-16 ENCOUNTER — Other Ambulatory Visit (INDEPENDENT_AMBULATORY_CARE_PROVIDER_SITE_OTHER): Payer: Self-pay | Admitting: Radiology

## 2018-12-16 MED ORDER — ONDANSETRON 4 MG PO TBDP: 4.0000 mg | ORAL_TABLET | Freq: Three times a day (TID) | ORAL | 0 refills | Status: DC | PRN

## 2018-12-16 MED ORDER — TRAMADOL HCL 50 MG PO TABS: 50.0000 mg | ORAL_TABLET | Freq: Four times a day (QID) | ORAL | 0 refills | Status: DC | PRN

## 2018-12-16 NOTE — Telephone Encounter (Signed)
Pt called into the office asking for a refill on tramadol.  [facility] [address]  Pt also asking for Zofran.

## 2018-12-16 NOTE — Telephone Encounter (Signed)
Sent new request to [facility]

## 2018-12-17 ENCOUNTER — Other Ambulatory Visit (INDEPENDENT_AMBULATORY_CARE_PROVIDER_SITE_OTHER): Payer: Self-pay | Admitting: Specialist

## 2018-12-17 NOTE — Telephone Encounter (Signed)
Order for tramadol sent to her pharmacy for renewal. jen

## 2018-12-18 NOTE — Telephone Encounter (Signed)
Please see below and advise. Thank you

## 2018-12-18 NOTE — Telephone Encounter (Signed)
Will call pt to advise tramadol faxed to pharm. The pt had also asked for an rx for zofran will ask Dr. Louanne Skye if he would like to fill this as well.

## 2018-12-22 ENCOUNTER — Other Ambulatory Visit (INDEPENDENT_AMBULATORY_CARE_PROVIDER_SITE_OTHER): Payer: Self-pay | Admitting: Specialist

## 2018-12-31 ENCOUNTER — Telehealth (INDEPENDENT_AMBULATORY_CARE_PROVIDER_SITE_OTHER): Payer: Self-pay

## 2018-12-31 NOTE — Telephone Encounter (Signed)
Patient requesting a refill of Tramadol.  [facility] [address]   Patient number [phone].

## 2019-01-01 ENCOUNTER — Other Ambulatory Visit (INDEPENDENT_AMBULATORY_CARE_PROVIDER_SITE_OTHER): Payer: Self-pay | Admitting: Radiology

## 2019-01-01 MED ORDER — TRAMADOL HCL 50 MG PO TABS: 50.0000 mg | ORAL_TABLET | Freq: Four times a day (QID) | ORAL | 0 refills | Status: DC | PRN

## 2019-01-01 NOTE — Telephone Encounter (Signed)
Sent request to Dr. Nitka 

## 2019-01-01 NOTE — Telephone Encounter (Signed)
I called and advised that her meds were sent in for her.

## 2019-01-06 ENCOUNTER — Telehealth (INDEPENDENT_AMBULATORY_CARE_PROVIDER_SITE_OTHER): Payer: Self-pay

## 2019-01-06 NOTE — Telephone Encounter (Signed)
Patient is having cramping in right knee.  It is very painful.  Please call to advise what might be done.

## 2019-01-06 NOTE — Telephone Encounter (Signed)
Patient is having cramping in right knee.  It is very painful.  Please call to advise what might be done.    [phone]

## 2019-01-07 ENCOUNTER — Telehealth (INDEPENDENT_AMBULATORY_CARE_PROVIDER_SITE_OTHER): Payer: Self-pay | Admitting: Radiology

## 2019-01-07 NOTE — Telephone Encounter (Signed)
I spoke with Alexandra Tran and she is concerned as physical therapy was discontinued with Alexandra Tran at the SNF. He was not seen in the office this past week due to no transportation being available to get him from the SNF to our Office. I will request a follow up visit and hopefully we can coordinate this with his SNF so they can transport him here to evaluation and radiographs of the cervical spine. 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 Mother explains that he has been placed on SSDI but medicaid is contingent on his not owning property and this is being handled by her and  Chief Executive Officer. She is going to call SNF to see if transportation can be arranged and if not when. We should reschedule a follow up appointment in the next 2 weeks. jen

## 2019-01-07 NOTE — Telephone Encounter (Signed)
I called patient to ask pre screening questions. Patient stated that she has become very worried about COVID-19 and that she no longer wants to come in for her appointment. She said that she would call she reschedule appointment when she felt safer.

## 2019-01-08 ENCOUNTER — Other Ambulatory Visit (INDEPENDENT_AMBULATORY_CARE_PROVIDER_SITE_OTHER): Payer: Self-pay | Admitting: Specialist

## 2019-01-08 NOTE — Telephone Encounter (Signed)
Rx request 

## 2019-01-11 ENCOUNTER — Ambulatory Visit (INDEPENDENT_AMBULATORY_CARE_PROVIDER_SITE_OTHER): Payer: Medicare Other | Admitting: Specialist

## 2019-01-13 NOTE — Telephone Encounter (Signed)
Moved note below to correct chart.

## 2019-01-18 ENCOUNTER — Other Ambulatory Visit: Payer: Self-pay

## 2019-01-18 ENCOUNTER — Ambulatory Visit (INDEPENDENT_AMBULATORY_CARE_PROVIDER_SITE_OTHER): Payer: Medicare Other | Admitting: Specialist

## 2019-01-18 ENCOUNTER — Encounter (INDEPENDENT_AMBULATORY_CARE_PROVIDER_SITE_OTHER): Payer: Self-pay | Admitting: Specialist

## 2019-01-18 ENCOUNTER — Ambulatory Visit (INDEPENDENT_AMBULATORY_CARE_PROVIDER_SITE_OTHER): Payer: Self-pay

## 2019-01-18 VITALS — BP 149/78 | HR 107 | Ht 64.0 in | Wt 205.0 lb

## 2019-01-18 DIAGNOSIS — Z96651 Presence of right artificial knee joint: Secondary | ICD-10-CM

## 2019-01-18 DIAGNOSIS — T8484XA Pain due to internal orthopedic prosthetic devices, implants and grafts, initial encounter: Secondary | ICD-10-CM

## 2019-01-18 MED ORDER — AMOXICILLIN-POT CLAVULANATE 875-125 MG PO TABS: ORAL_TABLET | ORAL | 0 refills | Status: AC

## 2019-01-18 MED ORDER — TRAMADOL HCL 50 MG PO TABS: 50.0000 mg | ORAL_TABLET | Freq: Four times a day (QID) | ORAL | 0 refills | Status: DC | PRN

## 2019-01-18 NOTE — Progress Notes (Signed)
73 year old white female who is about 37-month status post right total knee replacement returns.  States that she continues to have pain around the right knee along with right lower extremity swelling.  Right leg feels heavy at times.  She admits to not being very compliant with doing her home exercise program.  Continues to use a walker.  Seen by Dr. Sherren Mocha early last month and had venous Doppler which was negative for DVT.  Patient does wear knee-high compression stockings.   Exam Pleasant white female alert and oriented in no acute distress.  Ambulates well with a walker.  Right knee range of motion about 0 to 110 degrees.  Some knee swelling without large effusion.  Right lower leg edema.  Calf nontender.  Neurovascularly intact.    Plan Recommend the patient continue wearing her knee-high compression stockings.  Also recommend that she restart home exercise program with quad strengthening.  She was given a prescription for Augmentin for her dental appointment today.  Also refilled tramadol.  Patient did have an area of component loosening seen anterior aspect on lateral view.  We will continue to monitor this.

## 2019-01-20 ENCOUNTER — Telehealth: Payer: Self-pay | Admitting: Physical Therapy

## 2019-01-20 NOTE — Telephone Encounter (Signed)
Patient called requesting strengthening exercises as she lost her printout. Emailed exercises to her via [place] and will addend last PT note to list exercises. Siyana Erney C. Brittaney Beaulieu PT, DPT 01/20/19 2:05 PM

## 2019-02-03 ENCOUNTER — Other Ambulatory Visit: Payer: Self-pay | Admitting: Specialist

## 2019-02-03 MED ORDER — METHOCARBAMOL 500 MG PO TABS: 500.0000 mg | ORAL_TABLET | Freq: Four times a day (QID) | ORAL | 1 refills | Status: DC | PRN

## 2019-02-03 MED ORDER — TRAMADOL HCL 50 MG PO TABS: 50.0000 mg | ORAL_TABLET | Freq: Four times a day (QID) | ORAL | 0 refills | Status: DC | PRN

## 2019-02-03 NOTE — Telephone Encounter (Signed)
Pt called asking if she can have a refill on her methocarbamol and tramadol. [facility] [address]

## 2019-02-08 ENCOUNTER — Telehealth: Payer: Self-pay | Admitting: Specialist

## 2019-02-08 NOTE — Telephone Encounter (Signed)
Patient called concerning the methocarbamol and Tramadol. Patient said [facility] [address] in [place]   The number to contact patient is [phone]

## 2019-02-08 NOTE — Telephone Encounter (Signed)
I called both Rxs (methocarbamol and tramadol in to her pharm

## 2019-03-04 ENCOUNTER — Other Ambulatory Visit: Payer: Self-pay | Admitting: Specialist

## 2019-03-04 NOTE — Telephone Encounter (Signed)
Tramadol refill request 

## 2019-03-12 ENCOUNTER — Other Ambulatory Visit: Payer: Medicare Other

## 2019-03-15 ENCOUNTER — Other Ambulatory Visit: Payer: Medicare Other

## 2019-03-17 ENCOUNTER — Ambulatory Visit: Payer: Medicare Other

## 2019-03-17 ENCOUNTER — Ambulatory Visit: Payer: Medicare Other | Admitting: Oncology

## 2019-03-31 ENCOUNTER — Other Ambulatory Visit: Payer: Self-pay | Admitting: Radiology

## 2019-04-02 MED ORDER — ONDANSETRON 4 MG PO TBDP: ORAL_TABLET | ORAL | 1 refills | Status: DC

## 2019-04-06 ENCOUNTER — Other Ambulatory Visit: Payer: Self-pay | Admitting: Specialist

## 2019-04-06 NOTE — Telephone Encounter (Signed)
Pt lmom requesting a call back @ [phone]. She states she needs a Rx refill for Tramadol but she's currently in [place], [place] [address] and will give you the pharmacy when you call her.

## 2019-04-07 NOTE — Telephone Encounter (Signed)
Sent request to Dr. Louanne Skye for tramadol

## 2019-04-08 MED ORDER — TRAMADOL HCL 50 MG PO TABS: 50.0000 mg | ORAL_TABLET | Freq: Four times a day (QID) | ORAL | 0 refills | Status: DC | PRN

## 2019-05-06 ENCOUNTER — Other Ambulatory Visit: Payer: Self-pay | Admitting: Specialist

## 2019-05-06 ENCOUNTER — Telehealth: Payer: Self-pay | Admitting: Specialist

## 2019-05-06 MED ORDER — METHOCARBAMOL 500 MG PO TABS: 500.0000 mg | ORAL_TABLET | Freq: Four times a day (QID) | ORAL | 1 refills | Status: DC | PRN

## 2019-05-06 NOTE — Telephone Encounter (Signed)
Patient called left voicemail message needing Rx refilled Methocarbamol  The number to contact patient is [phone]

## 2019-05-06 NOTE — Telephone Encounter (Signed)
Patient called asked if the Rx can be sent to the [facility] in [place]   See previous note    [phone]

## 2019-05-06 NOTE — Telephone Encounter (Signed)
Request has been sent to Dr. Nitka 

## 2019-05-06 NOTE — Telephone Encounter (Signed)
Sent request to Dr. nitka 

## 2019-05-11 ENCOUNTER — Telehealth: Payer: Self-pay | Admitting: Specialist

## 2019-05-11 NOTE — Telephone Encounter (Signed)
Lmom that I [facility] and l[facility]

## 2019-05-11 NOTE — Telephone Encounter (Signed)
Patient left a message stating that the pharmacy did not receive the RX for the methocarbamol.  Patient uses the [facility] [address]  CB#[phone].  Thank you.

## 2019-05-11 NOTE — Telephone Encounter (Signed)
Patient left a message requesting that the Tramadol be transferred to the [facility] [address].  CB#[phone].  Thank you.

## 2019-05-13 NOTE — Telephone Encounter (Signed)
Patient called back in regards to her Tramadol RX being [facility] [address].  She stated that in order for the pharmacy here to fill her RX, we need to cancel the one that was sent to [facility] in [place].  Thank you.

## 2019-05-13 NOTE — Telephone Encounter (Signed)
I called patient and advised. 

## 2019-05-13 NOTE — Telephone Encounter (Signed)
I called and spoke with Larkin Ina at [facility] in [place]. He states that medication is ready for pick up but patient has not come to get it. I cancelled it from this pharmacy and called Jone Baseman at [facility] on [address] and they will now fill.

## 2019-05-27 ENCOUNTER — Ambulatory Visit: Payer: Medicare Other | Admitting: Specialist

## 2019-06-10 ENCOUNTER — Telehealth: Payer: Self-pay | Admitting: Specialist

## 2019-06-10 MED ORDER — METHOCARBAMOL 500 MG PO TABS: 500.0000 mg | ORAL_TABLET | Freq: Four times a day (QID) | ORAL | 1 refills | Status: DC | PRN

## 2019-06-10 MED ORDER — TRAMADOL HCL 50 MG PO TABS: 50.0000 mg | ORAL_TABLET | Freq: Four times a day (QID) | ORAL | 0 refills | Status: DC | PRN

## 2019-06-10 NOTE — Telephone Encounter (Signed)
Patient called asked if her Rx for (Methocarbamol and Tramadol can be sent to the [facility] in [place]   The number to the pharmacy is [phone]   The number to contact patient is [phone]

## 2019-06-10 NOTE — Telephone Encounter (Signed)
Sent request to Dr. nitka 

## 2019-06-11 NOTE — Telephone Encounter (Signed)
Patient called stating that she does have some of the medications that she requested yesterday.  She stated that it was up to Dr. Louanne Skye to go ahead and send in the medication because the pharmacy will not fill it until it is time.  Thank you.

## 2019-06-11 NOTE — Telephone Encounter (Signed)
I called and spoke with her, states that she does have some pills left and that she is not quite ready for these refills, I advised that they should be there when she is ready for them.

## 2019-07-09 ENCOUNTER — Other Ambulatory Visit: Payer: Self-pay | Admitting: Specialist

## 2019-07-09 NOTE — Telephone Encounter (Signed)
Ok to refill 

## 2019-07-28 ENCOUNTER — Other Ambulatory Visit: Payer: Self-pay | Admitting: Specialist

## 2019-07-29 ENCOUNTER — Telehealth: Payer: Self-pay | Admitting: Specialist

## 2019-07-29 ENCOUNTER — Other Ambulatory Visit: Payer: Self-pay | Admitting: Specialist

## 2019-07-29 MED ORDER — TRAMADOL HCL 50 MG PO TABS: 50.0000 mg | ORAL_TABLET | Freq: Four times a day (QID) | ORAL | 0 refills | Status: DC | PRN

## 2019-07-29 NOTE — Telephone Encounter (Signed)
Patient left a voicemail message requesting an RX refill on her Tramadol.  CB#[phone].  Thank you.

## 2019-07-29 NOTE — Telephone Encounter (Signed)
This has been sent to Dr. Louanne Skye

## 2019-08-17 ENCOUNTER — Other Ambulatory Visit: Payer: Self-pay | Admitting: Specialist

## 2019-09-02 ENCOUNTER — Other Ambulatory Visit: Payer: Self-pay

## 2019-09-02 ENCOUNTER — Encounter: Payer: Self-pay | Admitting: Specialist

## 2019-09-02 ENCOUNTER — Ambulatory Visit (INDEPENDENT_AMBULATORY_CARE_PROVIDER_SITE_OTHER): Payer: Medicare Other

## 2019-09-02 ENCOUNTER — Ambulatory Visit (INDEPENDENT_AMBULATORY_CARE_PROVIDER_SITE_OTHER): Payer: Medicare Other | Admitting: Specialist

## 2019-09-02 VITALS — BP 134/81 | HR 111 | Ht 64.0 in | Wt 205.0 lb

## 2019-09-02 DIAGNOSIS — M4690 Unspecified inflammatory spondylopathy, site unspecified: Secondary | ICD-10-CM

## 2019-09-02 DIAGNOSIS — M79671 Pain in right foot: Secondary | ICD-10-CM | POA: Diagnosis not present

## 2019-09-02 DIAGNOSIS — M7671 Peroneal tendinitis, right leg: Secondary | ICD-10-CM | POA: Diagnosis not present

## 2019-09-02 DIAGNOSIS — M79642 Pain in left hand: Secondary | ICD-10-CM

## 2019-09-02 MED ORDER — TRAMADOL HCL 50 MG PO TABS: 100.0000 mg | ORAL_TABLET | Freq: Four times a day (QID) | ORAL | 0 refills | Status: DC | PRN

## 2019-09-02 MED ORDER — DICLOFENAC SODIUM 1 % EX GEL: 2.0000 g | Freq: Four times a day (QID) | CUTANEOUS | 3 refills | Status: AC

## 2019-09-02 MED ORDER — ONDANSETRON HCL 4 MG PO TABS: 4.0000 mg | ORAL_TABLET | Freq: Three times a day (TID) | ORAL | 0 refills | Status: DC | PRN

## 2019-09-02 NOTE — Patient Instructions (Signed)
Use splint left index finger as much as possible to prevent deformity of the finger. If swelling worsens or there is any wound or drainage then go to the ER. Apply voltaren gel to the left index finger and the right lateral foot up to 4 times daily to decrease inflamation and pain. Return in 2 weeks for reassessment.

## 2019-09-02 NOTE — Progress Notes (Signed)
Office Visit Note   Patient: Alexandra Tran           Date of Birth: September 09, 1946           MRN: [id number] Visit Date: 09/02/2019              Requested by: Caprice Renshaw, MD [place],  [place] [address] PCP: Caprice Renshaw, MD   Assessment & Plan: Visit Diagnoses:  1. Pain in right foot   2. Pain in left hand   3. Peroneal tendonitis, right   4. 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 If swelling worsens or there is any wound or drainage then go to the ER. Apply voltaren gel to the left index finger and the right lateral foot up to 4 times daily to decrease inflamation and pain. Return in 2 weeks for reassessment.    Follow-Up Instructions: Return in about 2 weeks (around 09/16/2019).   Orders:  Orders Placed This Encounter  Procedures  . XR Hand Complete Left  . XR Foot Complete Right   Meds ordered this encounter  Medications  . diclofenac Sodium (VOLTAREN) 1 % GEL    Sig: Apply 2 g topically 4 (four) times daily.    Dispense:  350 g    Refill:  3  . traMADol (ULTRAM) 50 MG tablet    Sig: Take 2 tablets (100 mg total) by mouth every 6 (six) hours as needed.    Dispense:  30 tablet    Refill:  0  . ondansetron (ZOFRAN) 4 MG tablet    Sig: Take 1 tablet (4 mg total) by mouth every 8 (eight) hours as needed for nausea or vomiting.    Dispense:  20 tablet    Refill:  0      Procedures: No procedures performed   Clinical Data: No additional findings.   Subjective: Chief Complaint  Patient presents with  . Left Index Finger - Pain  . Right Foot - Pain    73 year old right handed female with history of long thoracolumbar fusion by Dr.Branch of [facility] and referred for persistent knee and body pains. She is seen today complaining of left index finger DIP flexion deformity and swelling and right lateral foot pain which she has had evaluated  by a podiatrist and was started on a compound ointment that she rubs into the right lateral foot 3-4 times a day. She has had right ring finger had previous. Also with right shoulder pain with right lateral upper arm pain. She takes gabapentin and tramadol for pain.  She requests other medication. She is status post right TKR one year ago 06/2018. The right  Knee is better but she has some residual stiffness. She is on fluid pills for swelling in the legs and has venous stasis changes in the legs.   Review of Systems  Constitutional: Negative for activity change, appetite change, chills, diaphoresis, fatigue, fever and unexpected weight change.  HENT: Negative for congestion, dental problem, drooling, ear discharge, ear pain, facial swelling, hearing loss, mouth sores, nosebleeds, postnasal drip, rhinorrhea, sinus pressure, sinus pain, sneezing, sore throat, tinnitus, trouble swallowing and voice change.   Eyes: Negative.  Negative for photophobia, pain, discharge, redness, itching and visual disturbance.  Respiratory: Negative.  Negative for apnea, cough, choking, chest tightness, shortness of breath, wheezing and stridor.   Cardiovascular: Positive for  leg swelling. Negative for chest pain and palpitations.  Gastrointestinal: Negative.  Negative for abdominal distention, abdominal pain, anal bleeding, blood in stool, constipation, diarrhea, nausea, rectal pain and vomiting.  Endocrine: Negative for cold intolerance, heat intolerance, polydipsia, polyphagia and polyuria.  Genitourinary: Positive for enuresis, frequency and urgency. Negative for difficulty urinating, dyspareunia and dysuria.  Musculoskeletal: Positive for joint swelling. Negative for arthralgias, back pain, gait problem, myalgias, neck pain and neck stiffness.  Skin: Negative.   Allergic/Immunologic: Negative.  Negative for environmental allergies, food allergies and immunocompromised state.  Neurological: Negative.  Negative for  dizziness, tremors, seizures, syncope, facial asymmetry, speech difficulty, weakness, light-headedness, numbness and headaches.  Hematological: Negative for adenopathy. Does not bruise/bleed easily.  Psychiatric/Behavioral: Negative for agitation, behavioral problems, confusion, decreased concentration, dysphoric mood, hallucinations, self-injury, sleep disturbance and suicidal ideas. The patient is not nervous/anxious and is not hyperactive.      Objective: Vital Signs: BP 134/81 (BP Location: Left Arm, Patient Position: Sitting)   Pulse (!) 111   Ht 5\' 4"  (1.626 m)   Wt 205 lb (93 kg)   BMI 35.19 kg/m   Physical Exam Constitutional:      Appearance: She is well-developed.  HENT:     Head: Normocephalic and atraumatic.  Eyes:     Pupils: Pupils are equal, round, and reactive to light.  Neck:     Musculoskeletal: Normal range of motion and neck supple.  Pulmonary:     Effort: Pulmonary effort is normal.     Breath sounds: Normal breath sounds.  Abdominal:     General: Bowel sounds are normal.     Palpations: Abdomen is soft.  Skin:    General: Skin is warm and dry.  Neurological:     Mental Status: She is alert and oriented to person, place, and time.  Psychiatric:        Behavior: Behavior normal.        Thought Content: Thought content normal.        Judgment: Judgment normal.     Right Ankle Exam   Tenderness  The patient is experiencing tenderness in the lateral malleolus. Swelling: mild  Range of Motion  Dorsiflexion: abnormal  Eversion:  10 abnormal   Muscle Strength  Dorsiflexion:  4/5 Anterior tibial:  5/5 Posterior tibial:  5/5 Gastrocsoleus:  5/5  Tests  Anterior drawer: negative Varus tilt: negative  Comments:  Edema right ankle present.   Left Ankle Exam  Left ankle exam is normal.  Tests  Anterior drawer: negative Varus tilt: negative   Back Exam   Tenderness  The patient is experiencing tenderness in the lumbar.   Left Hand  Exam   Tests  Phalen's Sign: negative Tinel's sign (median nerve): negative  Other  Erythema: absent Scars: absent Sensation: none Pulse: present  Comments:  Left index with flexion deformity of the DIP joint with inability to extend the finger and active flexion.       Specialty Comments:  No specialty comments available.  Imaging: Xr Foot Complete Right  Result Date: 09/02/2019 Mild degenerative joint changes right mid foot and cuboid 5th MT artherosclerotic changes of the PT and DP vessels.   Xr Hand Complete Left  Result Date: 09/02/2019 Lytic and cystic changes left index finger DIP joint consistent with an inflamatory arthritis vs infection, no warm is suspicious for inflamatory arthrosis    PMFS History: Patient Active Problem List   Diagnosis Date Noted  . Unilateral primary osteoarthritis, right knee 07/13/2018  Priority: High    Class: Chronic  . Acute blood loss anemia 07/14/2018    Priority: Medium    Class: Acute  . Peripheral vascular disease of extremity ([place]) 07/13/2018    Priority: Medium    Class: Chronic  . Anemia 08/05/2018  . Tachycardia 07/14/2018  . Diabetes mellitus with hyperglycemia ([place]) 07/14/2018  . Essential hypertension 07/14/2018  . S/P TKR (total knee replacement) using cement, right 07/13/2018   Past Medical History:  Diagnosis Date  . Complication of anesthesia   . Diabetes mellitus without complication ([place])   . DJD (degenerative joint disease)   . Hypertension   . PONV (postoperative nausea and vomiting)   . PVD (peripheral vascular disease) ([facility])     Family History  Problem Relation Age of Onset  . Diabetes Mother   . Prostate cancer Father   . Heart disease Brother   . Breast cancer Daughter     Past Surgical History:  Procedure Laterality Date  . ABDOMINAL HYSTERECTOMY    . BREAST EXCISIONAL BIOPSY Right    negative  . CATARACT EXTRACTION, BILATERAL    . EYE SURGERY    . GASTRIC BYPASS  2001  . TOTAL  KNEE ARTHROPLASTY Right 07/13/2018  . TOTAL KNEE ARTHROPLASTY Right 07/13/2018   Procedure: RIGHT TOTAL KNEE ARTHROPLASTY;  Surgeon: Jessy Oto, MD;  Location: [facility];  Service: Orthopedics;  Laterality: Right;  Needs RNFA   Social History   Occupational History  . Occupation: retired  Tobacco Use  . Smoking status: Never Smoker  . Smokeless tobacco: Never Used  Substance and Sexual Activity  . Alcohol use: No  . Drug use: No    Comment: uses CBD oil on her knee  . Sexual activity: Not on file

## 2019-09-14 ENCOUNTER — Telehealth: Payer: Self-pay | Admitting: Specialist

## 2019-09-14 NOTE — Telephone Encounter (Signed)
I called and lmom advised that I could get her in with Jeneen Rinks, on 09/16/2019 @ 2pm. If this did not work then she wold need to call me back.

## 2019-09-14 NOTE — Telephone Encounter (Signed)
Patient says 9:45 is too early. Would like a later time on a different day. Says she is to be worked in. Would like someone to call her. [phone]

## 2019-09-16 ENCOUNTER — Ambulatory Visit: Payer: Medicare Other | Admitting: Surgery

## 2019-09-17 ENCOUNTER — Other Ambulatory Visit: Payer: Self-pay | Admitting: Specialist

## 2019-09-17 ENCOUNTER — Ambulatory Visit: Payer: Medicare Other | Admitting: Specialist

## 2019-09-17 MED ORDER — METHOCARBAMOL 500 MG PO TABS: 500.0000 mg | ORAL_TABLET | Freq: Four times a day (QID) | ORAL | 0 refills | Status: DC | PRN

## 2019-09-17 NOTE — Telephone Encounter (Signed)
Patient called. Said she did not get the message to come in yesterday. Would like for you to call her. She also need more medication for spasms. Her call back number is [phone]

## 2019-09-17 NOTE — Addendum Note (Signed)
Addended by: Minda Ditto, Geoffery Spruce on: 09/17/2019 01:34 PM   Modules accepted: Orders

## 2019-09-22 ENCOUNTER — Other Ambulatory Visit: Payer: Self-pay

## 2019-09-22 ENCOUNTER — Ambulatory Visit (INDEPENDENT_AMBULATORY_CARE_PROVIDER_SITE_OTHER): Payer: Medicare Other | Admitting: Specialist

## 2019-09-22 ENCOUNTER — Encounter: Payer: Self-pay | Admitting: Specialist

## 2019-09-22 VITALS — BP 157/91 | HR 95 | Ht 64.0 in | Wt 205.0 lb

## 2019-09-22 DIAGNOSIS — M20011 Mallet finger of right finger(s): Secondary | ICD-10-CM

## 2019-09-22 DIAGNOSIS — M4690 Unspecified inflammatory spondylopathy, site unspecified: Secondary | ICD-10-CM

## 2019-09-22 DIAGNOSIS — M20012 Mallet finger of left finger(s): Secondary | ICD-10-CM

## 2019-09-22 NOTE — Patient Instructions (Signed)
Plan: Use splint left index finger as much as possible to prevent deformity of the finger. If swelling worsens or there is any wound or drainage then go to the ER. Apply voltaren gel to the left index finger and the right lateral foot up to 4 times daily to decrease inflamation and pain. Laboratory tests obtained Sed rate, ANA, Rheumatoid Factor, uric acid, CRP, if these test show inflamatory disease of the joints we will request a referral to a Rheumatologist to assess your joints.  Referral to vein specialist for assessment of bilateral chronic venous stasis.

## 2019-09-22 NOTE — Addendum Note (Signed)
Addended by: Minda Ditto, Alyse Low N on: 09/22/2019 03:04 PM   Modules accepted: Orders

## 2019-09-22 NOTE — Progress Notes (Signed)
Office Visit Note   Patient: Alexandra Tran           Date of Birth: January 16, 1946           MRN: [id number] Visit Date: 09/22/2019              Requested by: Caprice Renshaw, MD [place],  [place] [address] PCP: Caprice Renshaw, MD   Assessment & Plan: Visit Diagnoses:  1. 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 Mallet deformity of left index finger   3. 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 If swelling worsens or there is any wound or drainage then go to the ER. Apply voltaren gel to the left index finger and the right lateral foot up to 4 times daily to decrease inflamation and pain. Laboratory tests obtained Sed rate, ANA, Rheumatoid Factor, uric acid, CRP, if these test show inflamatory disease of the joints we will request a referral to a Rheumatologist to assess your joints.  Referral to vein specialist for assessment of bilateral chronic venous stasis. Return in 3 months for reassessment.    Follow-Up Instructions: Return in about 3 months (around 12/21/2019).   Orders:  Orders Placed This Encounter  Procedures  . Uric acid  . Rheumatoid Factor  . ANA Direct w/Reflex if Positive  . Sed Rate (ESR)  . C-reactive protein  . Ambulatory referral to Vascular Surgery   No orders of the defined types were placed in this encounter.     Procedures: No procedures performed   Clinical Data: No additional findings.   Subjective: Chief Complaint  Patient presents with  . Left Index Finger - Follow-up    73 year old female post right TKR with venous stasis changes both legs. She is having problems with left index DIP flexion deformity. Radiographs with erosion of the DIP joint. No  Warmth and no sign of infection. Also with right ring finger chronic flexion contracture that has been there for years. Right TKR is doing well. She wants a splint  for the right ring finger like she is using for the left index finger. No bowel or bladder difficulty.   Review of Systems  Constitutional: Negative.   HENT: Negative.   Eyes: Negative.   Respiratory: Negative.   Cardiovascular: Negative.   Gastrointestinal: Negative.   Endocrine: Negative.   Genitourinary: Negative.   Musculoskeletal: Negative.   Skin: Negative.   Allergic/Immunologic: Negative.   Neurological: Negative.   Hematological: Negative.   Psychiatric/Behavioral: Negative.      Objective: Vital Signs: BP (!) 157/91 (BP Location: Left Arm, Patient Position: Sitting)   Pulse 95   Ht _0  (1.626 m)   Wt 205 lb (93 kg)   BMI 35.19 kg/m   Physical Exam Constitutional:      Appearance: She is well-developed.  HENT:     Head: Normocephalic and atraumatic.  Eyes:     Pupils: Pupils are equal, round, and reactive to light.  Pulmonary:     Effort: Pulmonary effort is normal.     Breath sounds: Normal breath sounds.  Abdominal:     General: Bowel sounds are normal.     Palpations: Abdomen is soft.  Musculoskeletal:        General: Normal range of motion.     Cervical back: Normal range of motion and neck supple.  Skin:  General: Skin is warm and dry.  Neurological:     Mental Status: She is alert and oriented to person, place, and time.  Psychiatric:        Behavior: Behavior normal.        Thought Content: Thought content normal.        Judgment: Judgment normal.     Right Hand Exam   Tenderness  The patient is experiencing tenderness in the dorsal area.  Comments:  Right ring finger with flexion contracture 40 degrees unable to fully extend but there is some springy sensation to attempted extension.    Left Hand Exam   Tenderness  The patient is experiencing tenderness in the dorsal area.   Range of Motion  Hand  DIP Index: 40   Muscle Strength  Wrist extension: 5/5  Wrist flexion: 5/5  Grip:  5/5   Tests  Phalen's Sign:  negative Tinel's sign (median nerve): negative Finkelstein's test: negative  Other  Erythema: absent Scars: absent Sensation: normal Pulse: present  Comments:  Left index finger with better position and alignment with use of Stax splint.      Specialty Comments:  No specialty comments available.  Imaging: No results found.   PMFS History: Patient Active Problem List   Diagnosis Date Noted  . Unilateral primary osteoarthritis, right knee 07/13/2018    Priority: High    Class: Chronic  . Acute blood loss anemia 07/14/2018    Priority: Medium    Class: Acute  . Peripheral vascular disease of extremity ([place]) 07/13/2018    Priority: Medium    Class: Chronic  . Anemia 08/05/2018  . Tachycardia 07/14/2018  . Diabetes mellitus with hyperglycemia ([place]) 07/14/2018  . Essential hypertension 07/14/2018  . S/P TKR (total knee replacement) using cement, right 07/13/2018   Past Medical History:  Diagnosis Date  . Complication of anesthesia   . Diabetes mellitus without complication ([place])   . DJD (degenerative joint disease)   . Hypertension   . PONV (postoperative nausea and vomiting)   . PVD (peripheral vascular disease) ([facility])     Family History  Problem Relation Age of Onset  . Diabetes Mother   . Prostate cancer Father   . Heart disease Brother   . Breast cancer Daughter     Past Surgical History:  Procedure Laterality Date  . ABDOMINAL HYSTERECTOMY    . BREAST EXCISIONAL BIOPSY Right    negative  . CATARACT EXTRACTION, BILATERAL    . EYE SURGERY    . GASTRIC BYPASS  2001  . TOTAL KNEE ARTHROPLASTY Right 07/13/2018  . TOTAL KNEE ARTHROPLASTY Right 07/13/2018   Procedure: RIGHT TOTAL KNEE ARTHROPLASTY;  Surgeon: Jessy Oto, MD;  Location: [facility];  Service: Orthopedics;  Laterality: Right;  Needs RNFA   Social History   Occupational History  . Occupation: retired  Tobacco Use  . Smoking status: Never Smoker  . Smokeless tobacco: Never Used  Substance  and Sexual Activity  . Alcohol use: No  . Drug use: No    Comment: uses CBD oil on her knee  . Sexual activity: Not on file

## 2019-09-23 LAB — ANA: Anti Nuclear Antibody (ANA): NEGATIVE

## 2019-09-23 LAB — SEDIMENTATION RATE: Sed Rate: 11 mm/h (ref 0–30)

## 2019-09-23 LAB — C-REACTIVE PROTEIN: CRP: 0.5 mg/L (ref <8.0)

## 2019-09-23 LAB — RHEUMATOID FACTOR: Rheumatoid fact SerPl-aCnc: <14 IU/mL (ref <14)

## 2019-09-23 LAB — URIC ACID: Uric Acid, Serum: 3.9 mg/dL (ref 2.5–7.0)

## 2019-09-30 ENCOUNTER — Other Ambulatory Visit: Payer: Self-pay | Admitting: Specialist

## 2019-09-30 MED ORDER — TRAMADOL HCL 50 MG PO TABS: 100.0000 mg | ORAL_TABLET | Freq: Four times a day (QID) | ORAL | 0 refills | Status: AC | PRN

## 2019-09-30 NOTE — Telephone Encounter (Signed)
Patient called needing Rx for Tramadol called into the [facility] in [place]. The number to contact patient is [phone]

## 2019-09-30 NOTE — Telephone Encounter (Signed)
Sent request to Dr. Nitka 

## 2019-11-01 ENCOUNTER — Telehealth: Payer: Self-pay | Admitting: Specialist

## 2019-11-01 NOTE — Telephone Encounter (Signed)
Patient called, needs xray CD mailed to another doctor. I told her need to sign release. She wants to to fax the forms to her pcp's office where she lives. She will let them know they are being faxed there. I faxed records release forms to Dr. Angelina Ok office [phone]. She will sign and fax back to me.

## 2019-11-05 ENCOUNTER — Encounter: Payer: Self-pay | Admitting: *Deleted

## 2019-11-05 ENCOUNTER — Other Ambulatory Visit: Payer: Self-pay | Admitting: Specialist

## 2019-11-05 NOTE — Telephone Encounter (Signed)
Why is patient taking this medication?

## 2019-11-05 NOTE — Telephone Encounter (Signed)
Pt called in stating she needed her methocarbamol called in to her [facility] on [address] In [place].   [phone]

## 2019-11-05 NOTE — Telephone Encounter (Signed)
I called patient, no answer.

## 2019-11-05 NOTE — Telephone Encounter (Signed)
Can you please advise since Dr Nitka is out of the office? 

## 2019-11-08 MED ORDER — METHOCARBAMOL 500 MG PO TABS: 500.0000 mg | ORAL_TABLET | Freq: Four times a day (QID) | ORAL | 0 refills | Status: DC | PRN

## 2019-11-08 NOTE — Telephone Encounter (Signed)
Sent request to Dr. Nitka 

## 2019-12-13 ENCOUNTER — Telehealth: Payer: Self-pay | Admitting: Specialist

## 2019-12-13 NOTE — Telephone Encounter (Signed)
Pt called stating she will be coming in to sign a medical release form so that she can pick up her X-rays; she stated she would like to pick the x-rays up when she comes in to sign the release papers. She also stated she would like to update her pharmacy to [facility] on [address].   [phone]

## 2019-12-13 NOTE — Telephone Encounter (Signed)
Can you make her a CD?--Pharmacy has been updated

## 2019-12-14 NOTE — Telephone Encounter (Signed)
Called and advised patient that x-rays are ready for pick up

## 2019-12-17 ENCOUNTER — Other Ambulatory Visit: Payer: Self-pay | Admitting: Specialist

## 2019-12-20 ENCOUNTER — Other Ambulatory Visit: Payer: Self-pay | Admitting: *Deleted

## 2019-12-20 ENCOUNTER — Telehealth: Payer: Self-pay | Admitting: Specialist

## 2019-12-20 DIAGNOSIS — I739 Peripheral vascular disease, unspecified: Secondary | ICD-10-CM

## 2019-12-20 NOTE — Telephone Encounter (Signed)
Patient called. She would like for Christy to call her. [phone] about an appointment tomorrow.

## 2019-12-20 NOTE — Telephone Encounter (Signed)
I tried to call but her VM was full--I'll try again tomorrow

## 2019-12-21 ENCOUNTER — Other Ambulatory Visit: Payer: Self-pay

## 2019-12-21 ENCOUNTER — Ambulatory Visit (INDEPENDENT_AMBULATORY_CARE_PROVIDER_SITE_OTHER): Payer: Medicare Other | Admitting: Vascular Surgery

## 2019-12-21 ENCOUNTER — Encounter: Payer: Self-pay | Admitting: Vascular Surgery

## 2019-12-21 ENCOUNTER — Other Ambulatory Visit: Payer: Self-pay | Admitting: Specialist

## 2019-12-21 ENCOUNTER — Ambulatory Visit (HOSPITAL_COMMUNITY)
Admission: RE | Admit: 2019-12-21 | Discharge: 2019-12-21 | Disposition: A | Discharge status: Home / Self Care | Payer: Medicare Other | Source: Ambulatory Visit | Attending: Vascular Surgery | Admitting: Vascular Surgery

## 2019-12-21 VITALS — BP 114/73 | HR 100 | Temp 97.4°F | Resp 20 | Ht 64.0 in | Wt 200.0 lb

## 2019-12-21 DIAGNOSIS — I739 Peripheral vascular disease, unspecified: Secondary | ICD-10-CM | POA: Insufficient documentation

## 2019-12-21 DIAGNOSIS — R609 Edema, unspecified: Secondary | ICD-10-CM | POA: Diagnosis not present

## 2019-12-21 NOTE — Progress Notes (Signed)
Vascular and Vein Specialist of [facility]  Patient name: Alexandra Tran MRN: [id number] DOB: 06-08-1946 Sex: female  REASON FOR VISIT: Evaluation lower extremity swelling  HPI: Alexandra Tran is a 74 y.o. female here today for evaluation.  Of also discussing her is with her daughter by telephone due to Covid visitation restrictions.  The patient thinks that she is here for evaluation of trigger finger on her left index finger.  I explained that this is not our area specialty.  She actually has been referred due to leg swelling.  I saw the patient in March 2020 with similar difficulty in her right leg.  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  Noninvasive studies at that time revealed mild reflux with minimal dilatation in her right saphenous vein.  She denies history of DVT  Past Medical History:  Diagnosis Date  . Complication of anesthesia   . Diabetes mellitus without complication ([place])   . DJD (degenerative joint disease)   . Hypertension   . PONV (postoperative nausea and vomiting)   . PVD (peripheral vascular disease) ([facility])     Family History  Problem Relation Age of Onset  . Diabetes Mother   . Prostate cancer Father   . Heart disease Brother   . Breast cancer Daughter     SOCIAL HISTORY: Social History   Tobacco Use  . Smoking status: Never Smoker  . Smokeless tobacco: Never Used  Substance Use Topics  . Alcohol use: No    Allergies  Allergen Reactions  . Hydrocodone-Acetaminophen Nausea And Vomiting  . Nsaids Other (See Comments)    Has had gastric surgery    Current Outpatient Medications  Medication Sig Dispense Refill  . albuterol (PROVENTIL HFA;VENTOLIN HFA) 108 (90 Base) MCG/ACT inhaler Inhale 1-2 puffs into the lungs every 4 (four) hours as needed for wheezing or shortness of breath.     Marland Kitchen amoxicillin-clavulanate (AUGMENTIN) 875-125 MG tablet Take 1 tablet 1 hour before dental procedure and  then another tablet 8 hours after procedure 5 tablet 0  . aspirin 81 MG EC tablet Take 1 tablet (81 mg total) by mouth 2 (two) times daily after a meal. 30 tablet 1  . baclofen (LIORESAL) 10 MG tablet Take 1 tablet (10 mg total) by mouth 3 (three) times daily. 30 each 0  . cilostazol (PLETAL) 100 MG tablet Take 100 mg by mouth 2 (two) times daily.     . diclofenac Sodium (VOLTAREN) 1 % GEL Apply 2 g topically 4 (four) times daily. 350 g 3  . dorzolamide-timolol (COSOPT) 22.3-6.8 MG/ML ophthalmic solution Place 1 drop into both eyes 2 (two) times daily.    . furosemide (LASIX) 80 MG tablet Take 40 mg by mouth daily as needed for fluid.     Marland Kitchen gabapentin (NEURONTIN) 100 MG capsule Take 100 mg by mouth at bedtime.     . hydrochlorothiazide (HYDRODIURIL) 12.5 MG tablet Take 1 tablet by mouth daily.  3  . latanoprost (XALATAN) 0.005 % ophthalmic solution Place 1 drop into both eyes every morning.    . linaclotide (LINZESS) 290 MCG CAPS capsule Take 290 mcg by mouth daily before breakfast.    . losartan (COZAAR) 100 MG tablet Take 100 mg by mouth daily.  11  . metformin (FORTAMET) 1000 MG (OSM) 24 hr tablet Take 1,000 mg by mouth every morning.  10  . methocarbamol (ROBAXIN) 500 MG tablet Take 1 tablet (500 mg total) by mouth every 6 (six)  hours as needed. 40 tablet 0  . metoprolol succinate (TOPROL-XL) 50 MG 24 hr tablet Take 1 tablet (50 mg total) by mouth daily.    . montelukast (SINGULAIR) 10 MG tablet Take 10 mg by mouth every morning.    . Multiple Vitamins-Minerals (MULTIVITAMIN WITH MINERALS) tablet Take 1 tablet by mouth daily.    . ondansetron (ZOFRAN) 4 MG tablet TAKE 1 TABLET BY MOUTH EVERY 8 HOURS AS NEEDED FOR NAUSEA FOR VOMITING 18 tablet 0  . ondansetron (ZOFRAN-ODT) 4 MG disintegrating tablet TAKE 1 TABLET BY MOUTH EVERY 8 HOURS AS NEEDED FOR NAUSEA AND VOMITING. INS MAX 18/21 DAYS 18 tablet 1  . oxybutynin (DITROPAN) 5 MG tablet Take 5 mg by mouth daily.  1  . OZEMPIC, 0.25 OR 0.5  MG/DOSE, 2 MG/1.5ML SOPN Inject 50 Units into the muscle once a week. Wednesday    . pantoprazole (PROTONIX) 40 MG tablet Take 40 mg by mouth daily.    . phentermine (ADIPEX-P) 37.5 MG tablet Take 1 tablet by mouth daily before breakfast.   2  . potassium chloride SA (K-DUR,KLOR-CON) 20 MEQ tablet Take 20 mEq by mouth 2 (two) times daily. *DISSOLVABLE    . senna-docusate (SENOKOT-S) 8.6-50 MG tablet Take 1 tablet by mouth 2 (two) times daily.    . traMADol (ULTRAM) 50 MG tablet Take 2 tablets (100 mg total) by mouth every 6 (six) hours as needed. 30 tablet 0  . TRESIBA FLEXTOUCH 200 UNIT/ML SOPN Inject 25 Units into the muscle at bedtime.   10  . Vitamin D, Ergocalciferol, (DRISDOL) 50000 units CAPS capsule Take 50,000 Units by mouth every 7 (seven) days. Tuesday     No current facility-administered medications for this visit.    REVIEW OF SYSTEMS:  [X]  denotes positive finding, [ ]  denotes negative finding Cardiac  Comments:  Chest pain or chest pressure:    Shortness of breath upon exertion:    Short of breath when lying flat:    Irregular heart rhythm:        Vascular    Pain in calf, thigh, or hip brought on by ambulation:    Pain in feet at night that wakes you up from your sleep:     Blood clot in your veins:    Leg swelling:  x         PHYSICAL EXAM: Vitals:   12/21/19 0905  BP: 114/73  Pulse: 100  Resp: 20  Temp: (!) 97.4 F (36.3 C)  SpO2: 98%  Weight: 200 lb (90.7 kg)  Height: 5\' 4"  (1.626 m)    GENERAL: The patient is a well-nourished female, in no acute distress. The vital signs are documented above. CARDIOVASCULAR: Pitting edema bilaterally from her knees distally into her feet. PULMONARY: There is good air exchange  MUSCULOSKELETAL: There are no major deformities or cyanosis. NEUROLOGIC: No focal weakness or paresthesias are detected. SKIN: There are no ulcers or rashes noted. PSYCHIATRIC: The patient has a normal affect.  DATA:  Noninvasive studies  show no evidence of DVT.  She does have some reflux at the right saphenofemoral junction and at the level of the knee.  She does not have any particular dilatation of her saphenous vein bilaterally and on the left leg has no evidence of reflux in her saphenous vein  MEDICAL ISSUES: I discussed this with the patient and her daughter.  Explained to the patient that this is not a dangerous situation and that her only option would be conservative treatment.  She has tried diuretics but repeat ports this makes her feel weak.  I did explain the importance of elevation and compression.  She does wear a low-grade compression and I explained that higher grade would be more helpful if she is able to tolerate this.  She reports that she has these and does have some difficulty wearing them.  She was reassured with this discussion will see [place] again on an as-needed basis    Rosetta Posner, MD [facility] Vascular and Vein Specialists of [facility] Tel [phone] Pager [phone]

## 2019-12-21 NOTE — Telephone Encounter (Signed)
sent request to Dr. Louanne Skye

## 2019-12-21 NOTE — Telephone Encounter (Signed)
Pt called in stating she would like a refill of her methocarbamol sent in to her [facility].  [phone]

## 2019-12-21 NOTE — Telephone Encounter (Signed)
See other message requesting refill

## 2019-12-22 MED ORDER — METHOCARBAMOL 500 MG PO TABS: 500.0000 mg | ORAL_TABLET | Freq: Four times a day (QID) | ORAL | 0 refills | Status: AC | PRN

## 2019-12-23 ENCOUNTER — Ambulatory Visit: Payer: Medicare Other | Admitting: Specialist

## 2020-01-03 ENCOUNTER — Ambulatory Visit: Payer: Medicare Other

## 2020-01-12 ENCOUNTER — Ambulatory Visit: Payer: Medicare Other | Attending: Internal Medicine

## 2020-01-12 DIAGNOSIS — Z23 Encounter for immunization: Secondary | ICD-10-CM

## 2020-01-12 NOTE — Progress Notes (Signed)
   Covid-19 Vaccination Clinic  Name:  Alexandra Tran    MRN: [id number] DOB: 28-Nov-1945  01/12/2020  [name] was observed post Covid-19 immunization for 15 minutes without incident. She was provided with Vaccine Information Sheet and instruction to access the V-Safe system.   [name] was instructed to call 911 with any severe reactions post vaccine: Marland Kitchen Difficulty breathing  . Swelling of face and throat  . A fast heartbeat  . A bad rash all over body  . Dizziness and weakness   Immunizations Administered    Name Date Dose VIS Date Route   Pfizer COVID-19 Vaccine 01/12/2020  1:46 PM 0.3 mL 09/10/2019 Intramuscular   Manufacturer: [employer], [employer]   Lot: [id number]   [place]: [id number]

## 2020-05-11 ENCOUNTER — Telehealth: Payer: Self-pay | Admitting: Specialist

## 2020-05-11 NOTE — Telephone Encounter (Signed)
Patient called.   She wanted to know if she is to still be taking her antibiotics.   Call back: [phone]

## 2020-05-12 NOTE — Telephone Encounter (Signed)
I called and lmom advising that we have not seen her in a long time, so we would have no reason for her to be on the antibiotics at this time, however if she feels like she may need something that she should contact her PCP and have them evaluate her for it.

## 2020-06-08 DIAGNOSIS — C50412 Malignant neoplasm of upper-outer quadrant of left female breast: Secondary | ICD-10-CM | POA: Insufficient documentation

## 2020-06-08 DIAGNOSIS — Z170 Estrogen receptor positive status [ER+]: Secondary | ICD-10-CM | POA: Insufficient documentation

## 2020-06-22 ENCOUNTER — Other Ambulatory Visit: Payer: Self-pay | Admitting: Radiology

## 2020-06-22 MED ORDER — ONDANSETRON 4 MG PO TBDP
ORAL_TABLET | ORAL | 1 refills | Status: DC
Start: 2020-06-22 — End: 2024-07-30

## 2020-07-08 ENCOUNTER — Other Ambulatory Visit: Payer: Self-pay | Admitting: Specialist

## 2020-07-14 NOTE — Telephone Encounter (Signed)
E-prescribe down. I called pharmacy to advise of refusal. [facility] is experiencing phone problems and unable to take call. Asked to try call again. Sorry.

## 2020-10-24 DIAGNOSIS — M8589 Other specified disorders of bone density and structure, multiple sites: Secondary | ICD-10-CM | POA: Insufficient documentation

## 2021-05-08 ENCOUNTER — Emergency Department: Payer: Medicare Other

## 2021-05-08 ENCOUNTER — Encounter: Payer: Self-pay | Admitting: Oncology

## 2021-05-08 ENCOUNTER — Encounter: Payer: Self-pay | Admitting: Physician Assistant

## 2021-05-08 ENCOUNTER — Other Ambulatory Visit: Payer: Self-pay

## 2021-05-08 ENCOUNTER — Emergency Department
Admission: EM | Admit: 2021-05-08 | Discharge: 2021-05-08 | Disposition: A | Discharge status: Home / Self Care | Payer: Medicare Other | Attending: Emergency Medicine | Admitting: Emergency Medicine

## 2021-05-08 DIAGNOSIS — E119 Type 2 diabetes mellitus without complications: Secondary | ICD-10-CM | POA: Diagnosis not present

## 2021-05-08 DIAGNOSIS — I10 Essential (primary) hypertension: Secondary | ICD-10-CM | POA: Insufficient documentation

## 2021-05-08 DIAGNOSIS — I6782 Cerebral ischemia: Secondary | ICD-10-CM | POA: Diagnosis not present

## 2021-05-08 DIAGNOSIS — Z96651 Presence of right artificial knee joint: Secondary | ICD-10-CM | POA: Diagnosis not present

## 2021-05-08 DIAGNOSIS — Z79899 Other long term (current) drug therapy: Secondary | ICD-10-CM | POA: Diagnosis not present

## 2021-05-08 DIAGNOSIS — H43393 Other vitreous opacities, bilateral: Secondary | ICD-10-CM

## 2021-05-08 DIAGNOSIS — Z7982 Long term (current) use of aspirin: Secondary | ICD-10-CM | POA: Diagnosis not present

## 2021-05-08 DIAGNOSIS — R4781 Slurred speech: Secondary | ICD-10-CM | POA: Diagnosis present

## 2021-05-08 DIAGNOSIS — F4310 Post-traumatic stress disorder, unspecified: Secondary | ICD-10-CM | POA: Diagnosis not present

## 2021-05-08 DIAGNOSIS — Z7984 Long term (current) use of oral hypoglycemic drugs: Secondary | ICD-10-CM | POA: Diagnosis not present

## 2021-05-08 MED ORDER — OXYCODONE-ACETAMINOPHEN 5-325 MG PO TABS
1.0000 | ORAL_TABLET | Freq: Once | ORAL | Status: DC
  Filled 2021-05-08: qty 1
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
## 2021-05-18 ENCOUNTER — Encounter: Payer: Self-pay | Admitting: Oncology

## 2021-06-19 DIAGNOSIS — R519 Headache, unspecified: Secondary | ICD-10-CM | POA: Insufficient documentation

## 2021-06-19 DIAGNOSIS — F39 Unspecified mood [affective] disorder: Secondary | ICD-10-CM | POA: Insufficient documentation

## 2021-06-19 DIAGNOSIS — R202 Paresthesia of skin: Secondary | ICD-10-CM | POA: Insufficient documentation

## 2021-06-19 DIAGNOSIS — R200 Anesthesia of skin: Secondary | ICD-10-CM | POA: Insufficient documentation

## 2021-07-23 ENCOUNTER — Encounter: Payer: Self-pay | Admitting: Oncology

## 2021-07-23 ENCOUNTER — Emergency Department (HOSPITAL_COMMUNITY)
Admission: EM | Admit: 2021-07-23 | Discharge: 2021-07-24 | Disposition: A | Discharge status: Home / Self Care | Payer: Medicare Other | Attending: Emergency Medicine | Admitting: Emergency Medicine

## 2021-07-23 ENCOUNTER — Emergency Department (HOSPITAL_COMMUNITY): Payer: Medicare Other

## 2021-07-23 ENCOUNTER — Other Ambulatory Visit: Payer: Self-pay

## 2021-07-23 DIAGNOSIS — M79661 Pain in right lower leg: Secondary | ICD-10-CM | POA: Insufficient documentation

## 2021-07-23 DIAGNOSIS — Z7982 Long term (current) use of aspirin: Secondary | ICD-10-CM | POA: Diagnosis not present

## 2021-07-23 DIAGNOSIS — Z7984 Long term (current) use of oral hypoglycemic drugs: Secondary | ICD-10-CM | POA: Diagnosis not present

## 2021-07-23 DIAGNOSIS — E1165 Type 2 diabetes mellitus with hyperglycemia: Secondary | ICD-10-CM | POA: Insufficient documentation

## 2021-07-23 DIAGNOSIS — Z79899 Other long term (current) drug therapy: Secondary | ICD-10-CM | POA: Diagnosis not present

## 2021-07-23 DIAGNOSIS — I10 Essential (primary) hypertension: Secondary | ICD-10-CM | POA: Insufficient documentation

## 2021-07-23 DIAGNOSIS — Z96651 Presence of right artificial knee joint: Secondary | ICD-10-CM | POA: Diagnosis not present

## 2021-07-23 DIAGNOSIS — M79604 Pain in right leg: Secondary | ICD-10-CM
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
## 2021-07-24 DIAGNOSIS — M79661 Pain in right lower leg: Secondary | ICD-10-CM | POA: Diagnosis not present

## 2021-07-24 LAB — CBG MONITORING, ED
Glucose-Capillary: 82 mg/dL (ref 70–99)
Glucose-Capillary: 89 mg/dL (ref 70–99)
Glucose-Capillary: 140 mg/dL — ABNORMAL HIGH (ref 70–99)
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
## 2021-07-24 NOTE — ED Notes (Signed)
Patient sleeping and SW arranging wheelchair for patient for home use. Family remains at bedside

## 2021-07-24 NOTE — Discharge Planning (Signed)
Tram Wrenn J. Clydene Laming, RN, BSN, [place] [phone] RNCM spoke with pt at bedside regarding discharge planning for [place]. Offered pt medicare.gov list of home health agencies to choose from.  Pt chose [facility] to render services. Tommi Rumps of [facility] notified. Patient made aware that [facility] will be in contact in 24-48 hours.  Fuller Mandril, RN, BSN, [place] [phone] Pt qualifies for DME standard wheelchair.  DME  ordered through [facility].  Freda Munro of [facility] notified to deliver to pt room prior to D/C home.

## 2021-07-25 ENCOUNTER — Telehealth: Payer: Self-pay | Admitting: Specialist

## 2021-07-25 NOTE — Telephone Encounter (Signed)
Spoke with Jenny Reichmann from [facility] home health and informed her that physical could be potentially be placed once she is evaluated by Benjiman Core on 08/02/2021 if he feels like she would benefit from referral. Jenny Reichmann states that she understood and would reach out to the patient's daughter to make her aware.

## 2021-07-25 NOTE — Telephone Encounter (Signed)
Received call from [place] with [facility] health advised patient went to the ER yesterday. Cindy asked if Dr Louanne Skye will write order for [facility] (PT and OT) Also, she will need an order for a Home Health Aid for the patient. The number to contact Jenny Reichmann is [phone]

## 2021-08-02 ENCOUNTER — Encounter: Payer: Self-pay | Admitting: Surgery

## 2021-08-02 ENCOUNTER — Telehealth: Payer: Self-pay | Admitting: *Deleted

## 2021-08-02 ENCOUNTER — Other Ambulatory Visit: Payer: Self-pay

## 2021-08-02 ENCOUNTER — Ambulatory Visit (INDEPENDENT_AMBULATORY_CARE_PROVIDER_SITE_OTHER): Payer: Medicare Other | Admitting: Surgery

## 2021-08-02 VITALS — BP 157/99 | HR 117

## 2021-08-02 DIAGNOSIS — R600 Localized edema: Secondary | ICD-10-CM

## 2021-08-02 DIAGNOSIS — R29898 Other symptoms and signs involving the musculoskeletal system: Secondary | ICD-10-CM

## 2021-08-02 DIAGNOSIS — R5383 Other fatigue: Secondary | ICD-10-CM

## 2021-08-02 NOTE — Telephone Encounter (Signed)
Pt was in office to see Benjiman Core PA-C today and Jeneen Rinks wanted pt to be seen after evaluating her to possibly see a cardiologist for worsening Bilateral LE edema questionable CHF eval. I informed pt of what Jeneen Rinks recommended and to also follow up with Dr. Louanne Skye in 6 weeks and she and daughter both asked why do they need dto evaluate when he didn't do anything for her. Stated they are not gong to follow up and that her PCP is not a cardiologist and feels like she does not need to see a cardiology. Pt and daughter disagrees and checked out.
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
## 2021-08-06 ENCOUNTER — Telehealth: Payer: Self-pay | Admitting: *Deleted

## 2021-08-06 ENCOUNTER — Other Ambulatory Visit: Payer: Self-pay

## 2021-08-06 ENCOUNTER — Telehealth: Payer: Self-pay

## 2021-08-06 DIAGNOSIS — I739 Peripheral vascular disease, unspecified: Secondary | ICD-10-CM

## 2021-08-06 NOTE — Telephone Encounter (Signed)
Alexandra Tran with [facility] (PCP) returned call and stated can see pt on Nov 14 at 145pm, I called pt, no answer, no vm.

## 2021-08-06 NOTE — Telephone Encounter (Signed)
Patient's daughter calls to report swelling and dark toes on patient's right lower extremity. Information unclear - very concerned about "the 3+ pitting dependent edema* Says it hurts all the time, but less at times and thinks it is because of edema. Says she is able to move the leg and has sensation. Toes are "darker than normal and it is moving up the leg." Patient does have some known reflux and has been seen by [facility]. Patient only able to come this Friday. Placed on schedule for ABI and follow up.

## 2021-08-09 ENCOUNTER — Other Ambulatory Visit: Payer: Self-pay

## 2021-08-09 DIAGNOSIS — R609 Edema, unspecified: Secondary | ICD-10-CM

## 2021-08-09 DIAGNOSIS — J309 Allergic rhinitis, unspecified: Secondary | ICD-10-CM | POA: Insufficient documentation

## 2021-08-09 NOTE — Progress Notes (Signed)
[facility] [place]   Reason for referral: Swollen B leg  History of Present Illness  Alexandra Tran is a 75 y.o. female who presents with chief complaint: swollen legs with increased skin changes.  She has a 3 year history of right LE edema and has seen Dr. Donnetta Hutching in the past.  Noninvasive studies at that time revealed mild reflux with minimal dilatation in her right saphenous vein.  She denies history of DVT.    She has worn compression in the past.  She has been prescribed Lasix which she does not take regular and she has had compression in the past that is not used everyday.  She denise non healing wounds.  She has had darkening of the toes and right foot over time that seems to be her biggest concern.  Her mobility is limited and she is in [facility] PT for improved mobility.    Past Medical History:  Diagnosis Date   Complication of anesthesia    Diabetes mellitus without complication (HCC)    DJD (degenerative joint disease)    Hypertension    PONV (postoperative nausea and vomiting)    PVD (peripheral vascular disease) ([place])     Past Surgical History:  Procedure Laterality Date   ABDOMINAL HYSTERECTOMY     BREAST EXCISIONAL BIOPSY Right    negative   CATARACT EXTRACTION, BILATERAL     EYE SURGERY     GASTRIC BYPASS  2001   TOTAL KNEE ARTHROPLASTY Right 07/13/2018   TOTAL KNEE ARTHROPLASTY Right 07/13/2018   Procedure: RIGHT TOTAL KNEE ARTHROPLASTY;  Surgeon: Jessy Oto, MD;  Location: [place];  Service: Orthopedics;  Laterality: Right;  Needs RNFA    Social History   Socioeconomic History   Marital status: Widowed    Spouse name: Not on file   Number of children: Not on file   Years of education: Not on file   Highest education level: Not on file  Occupational History   Occupation: retired  Tobacco Use   Smoking status: Never   Smokeless tobacco: Never  Vaping Use   Vaping Use: Never used  Substance and Sexual Activity   Alcohol use: No    Drug use: No    Comment: uses CBD oil on her knee   Sexual activity: Not on file  Other Topics Concern   Not on file  Social History Narrative   Not on file   Social Determinants of Health   Financial Resource Strain: Not on file  Food Insecurity: Not on file  Transportation Needs: Not on file  Physical Activity: Not on file  Stress: Not on file  Social Connections: Not on file  Intimate Partner Violence: Not on file    Family History  Problem Relation Age of Onset   Diabetes Mother    Prostate cancer Father    Heart disease Brother    Breast cancer Daughter     Current Outpatient Medications on File Prior to Visit  Medication Sig Dispense Refill   albuterol (PROVENTIL HFA;VENTOLIN HFA) 108 (90 Base) MCG/ACT inhaler Inhale 1-2 puffs into the lungs every 4 (four) hours as needed for wheezing or shortness of breath.      amoxicillin-clavulanate (AUGMENTIN) 875-125 MG tablet Take 1 tablet 1 hour before dental procedure and then another tablet 8 hours after procedure 5 tablet 0   aspirin 81 MG EC tablet Take 1 tablet (81 mg total) by mouth 2 (two) times daily after a meal. 30 tablet 1  baclofen (LIORESAL) 10 MG tablet Take 1 tablet (10 mg total) by mouth 3 (three) times daily. 30 each 0   cilostazol (PLETAL) 100 MG tablet Take 100 mg by mouth 2 (two) times daily.      diclofenac Sodium (VOLTAREN) 1 % GEL Apply 2 g topically 4 (four) times daily. 350 g 3   dorzolamide-timolol (COSOPT) 22.3-6.8 MG/ML ophthalmic solution Place 1 drop into both eyes 2 (two) times daily.     furosemide (LASIX) 80 MG tablet Take 40 mg by mouth daily as needed for fluid.      gabapentin (NEURONTIN) 100 MG capsule Take 100 mg by mouth at bedtime.      hydrochlorothiazide (HYDRODIURIL) 12.5 MG tablet Take 1 tablet by mouth daily.  3   latanoprost (XALATAN) 0.005 % ophthalmic solution Place 1 drop into both eyes every morning.     linaclotide (LINZESS) 290 MCG CAPS capsule Take 290 mcg by mouth daily  before breakfast.     losartan (COZAAR) 100 MG tablet Take 100 mg by mouth daily.  11   metformin (FORTAMET) 1000 MG (OSM) 24 hr tablet Take 1,000 mg by mouth every morning.  10   methocarbamol (ROBAXIN) 500 MG tablet Take 1 tablet (500 mg total) by mouth every 6 (six) hours as needed. 40 tablet 0   metoprolol succinate (TOPROL-XL) 50 MG 24 hr tablet Take 1 tablet (50 mg total) by mouth daily.     montelukast (SINGULAIR) 10 MG tablet Take 10 mg by mouth every morning.     Multiple Vitamins-Minerals (MULTIVITAMIN WITH MINERALS) tablet Take 1 tablet by mouth daily.     ondansetron (ZOFRAN) 4 MG tablet TAKE 1 TABLET BY MOUTH EVERY 8 HOURS AS NEEDED FOR NAUSEA FOR VOMITING 18 tablet 0   ondansetron (ZOFRAN-ODT) 4 MG disintegrating tablet TAKE 1 TABLET BY MOUTH EVERY 8 HOURS AS NEEDED FOR NAUSEA AND VOMITING. INS MAX 18/21 DAYS 18 tablet 1   oxybutynin (DITROPAN) 5 MG tablet Take 5 mg by mouth daily.  1   OZEMPIC, 0.25 OR 0.5 MG/DOSE, 2 MG/1.5ML SOPN Inject 50 Units into the muscle once a week. Wednesday     pantoprazole (PROTONIX) 40 MG tablet Take 40 mg by mouth daily.     phentermine (ADIPEX-P) 37.5 MG tablet Take 1 tablet by mouth daily before breakfast.   2   potassium chloride SA (K-DUR,KLOR-CON) 20 MEQ tablet Take 20 mEq by mouth 2 (two) times daily. *DISSOLVABLE     senna-docusate (SENOKOT-S) 8.6-50 MG tablet Take 1 tablet by mouth 2 (two) times daily.     traMADol (ULTRAM) 50 MG tablet Take 2 tablets (100 mg total) by mouth every 6 (six) hours as needed. 30 tablet 0   TRESIBA FLEXTOUCH 200 UNIT/ML SOPN Inject 25 Units into the muscle at bedtime.   10   Vitamin D, Ergocalciferol, (DRISDOL) 50000 units CAPS capsule Take 50,000 Units by mouth every 7 (seven) days. Tuesday     No current facility-administered medications on file prior to visit.    Allergies as of 08/10/2021 - Review Complete 07/23/2021  Allergen Reaction Noted   Hydrocodone-acetaminophen Nausea And Vomiting 03/03/2012    Nsaids Other (See Comments) 01/26/2014     ROS:   General:  No weight loss, Fever, chills  HEENT: No recent headaches, no nasal bleeding, no visual changes, no sore throat  Neurologic: No dizziness, blackouts, seizures. No recent symptoms of stroke or mini- stroke. No recent episodes of slurred speech, or temporary blindness.  Cardiac: No recent  episodes of chest pain/pressure, no shortness of breath at rest.  No shortness of breath with exertion.  Denies history of atrial fibrillation or irregular heartbeat  Vascular: No history of rest pain in feet.  No history of claudication.  No history of non-healing ulcer, No history of DVT   Pulmonary: No home oxygen, no productive cough, no hemoptysis,  No asthma or wheezing  Musculoskeletal:  [ ]  Arthritis, [x ] Low back pain,  [ ]  Joint pain  Hematologic:No history of hypercoagulable state.  No history of easy bleeding.  No history of anemia  Gastrointestinal: No hematochezia or melena,  No gastroesophageal reflux, no trouble swallowing  Urinary: [ ]  chronic Kidney disease, [ ]  on HD - [ ]  MWF or [ ]  TTHS, [ ]  Burning with urination, [ ]  Frequent urination, [ ]  Difficulty urinating;   Skin: No rashes  Psychological: No history of anxiety,  No history of depression  Physical Examination  Vitals:   08/10/21 0832  BP: 135/79  Pulse: (!) 111  Resp: 20  Temp: 98 F (36.7 C)  TempSrc: Temporal  SpO2: 99%  Weight: 192 lb (87.1 kg)  Height: 5\' 4"  (1.626 m)    Body mass index is 32.96 kg/m.  General:  Alert and oriented, no acute distress HEENT: Normal Neck: No bruit or JVD Pulmonary: Clear to auscultation bilaterally Cardiac: Regular Rate and Rhythm with systolic murmur Abdomen: Soft, non-tender, non-distended, no mass, no scars Skin: No rash, darkening of the toes B, and right foot into the dorsum.  No open wounds, no weeping of fluid.   Extremity Pulses:  2+ radial, brachial, femoral, Doppler signals dorsalis pedis,  posterior tibial  bilaterally Musculoskeletal: B LE edema  Neurologic: Upper and lower extremity motor grossly intact and symmetric  DATA:    Venous Reflux Times  +--------------+---------+------+-----------+------------+--------------+  RIGHT         Reflux NoRefluxReflux TimeDiameter cmsComments                                Yes                                         +--------------+---------+------+-----------+------------+--------------+  CFV                     yes   >1 second                             +--------------+---------+------+-----------+------------+--------------+  FV mid        no                                                    +--------------+---------+------+-----------+------------+--------------+  Popliteal     no                                                    +--------------+---------+------+-----------+------------+--------------+  GSV at [facility]    no  0.501                    +--------------+---------+------+-----------+------------+--------------+  GSV prox thighno                           0.357                    +--------------+---------+------+-----------+------------+--------------+  GSV mid thigh no                           0.457                    +--------------+---------+------+-----------+------------+--------------+  GSV dist thighno                           0.402                    +--------------+---------+------+-----------+------------+--------------+  GSV at knee   no                           0.330                    +--------------+---------+------+-----------+------------+--------------+  GSV prox calf                                       not visualized  +--------------+---------+------+-----------+------------+--------------+  SSV Pop Fossa no                           0.212                     +--------------+---------+------+-----------+------------+--------------+  SSV prox calf no                           0.154                    +--------------+---------+------+-----------+------------+--------------+  SSV mid calf  no                           0.175                    +--------------+---------+------+-----------+------------+--------------+           Summary:  Right:  - No evidence of deep vein thrombosis seen in the right lower extremity,  from the common femoral through the popliteal veins.  - No evidence of superficial venous thrombosis in the right lower  extremity.     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  - No evidence of superficial vein reflux.     Assessment: No evidence of reflux in the GSV or SFJ.  No DVT. Chronic peripheral edema of unknown cause.  I did auscultate a systolic murmur today on exam, her lungs are clear.  She has no SOB and non labored breathing, no JVD. She has good doppler signals B LE.  She is not at risk of limb loss.  Plan:She will elevate her legs above the level of her heart a few times a day, wear compression daily, and increase her activity as she tolerates.  Conservative management of B  LE edema.  No vascular intervention is advised.    Roxy Horseman PA-C [facility] [place] Office: [phone]

## 2021-08-10 ENCOUNTER — Ambulatory Visit (INDEPENDENT_AMBULATORY_CARE_PROVIDER_SITE_OTHER): Payer: Medicare Other | Admitting: Physician Assistant

## 2021-08-10 ENCOUNTER — Other Ambulatory Visit: Payer: Self-pay

## 2021-08-10 ENCOUNTER — Ambulatory Visit (HOSPITAL_COMMUNITY)
Admission: RE | Admit: 2021-08-10 | Discharge: 2021-08-10 | Disposition: A | Discharge status: Home / Self Care | Payer: Medicare Other | Source: Ambulatory Visit | Attending: Vascular Surgery | Admitting: Vascular Surgery

## 2021-08-10 VITALS — BP 135/79 | HR 111 | Temp 98.0°F | Resp 20 | Ht 64.0 in | Wt 192.0 lb

## 2021-08-10 DIAGNOSIS — R609 Edema, unspecified: Secondary | ICD-10-CM

## 2021-08-22 ENCOUNTER — Telehealth: Payer: Self-pay | Admitting: Radiology

## 2021-08-22 NOTE — Telephone Encounter (Signed)
I called and advised patients daughter Otila Kluver that her PCP is the one that is supposed to be making the referral to cardiology. She said that she would get the info to her other sister.

## 2021-09-26 ENCOUNTER — Telehealth (HOSPITAL_COMMUNITY): Payer: Self-pay | Admitting: Internal Medicine

## 2021-09-26 NOTE — Telephone Encounter (Signed)
Pt called to f/u w/referral for DB from DR Angelina Ok, please advise

## 2021-10-05 ENCOUNTER — Telehealth: Payer: Self-pay

## 2021-10-05 ENCOUNTER — Encounter: Payer: Self-pay | Admitting: Oncology

## 2021-10-05 NOTE — Telephone Encounter (Signed)
NOTES SCANNED TO REFERRAL 

## 2021-10-10 ENCOUNTER — Ambulatory Visit (HOSPITAL_BASED_OUTPATIENT_CLINIC_OR_DEPARTMENT_OTHER): Payer: Medicare PPO | Admitting: Cardiology

## 2021-10-10 NOTE — Progress Notes (Incomplete)
Cardiology Office Note:    Date:  10/10/2021   ID:  VALRIE Tran, DOB [birth year], MRN [id number]  PCP:  Caprice Renshaw, MD  Cardiologist:  None  Referring MD: Caprice Renshaw, MD   No chief complaint on file.   History of Present Illness:    Alexandra Tran is a 76 y.o. female with a hx of hypertension, hyperlipidemia, peripheral vascular disease, diabetes, and degenerative joint disease, who is seen as a new consult at the request of Caprice Renshaw, MD for the evaluation and management of chest pain, hyperlipidemia, hypertension, dyspnea, and rapid heart rate.  10/05/2021 Notes from Dr. Othella Boyer reviewed: She was referred to cardiology to evaluate for stress test if needed.  Cardiovascular risk factors: Prior clinical ASCVD:  Comorbid conditions, including hypertension, hyperlipidemia, diabetes, chronic kidney disease:  Metabolic syndrome/Obesity: Chronic inflammatory conditions: Tobacco use history: Family history: Prior cardiac testing and/or incidental findings on other testing (ie coronary calcium): Exercise level: Current diet:  Chest pain: -Initial onset: -Quality: -Frequency: -Duration: -Associated symptoms: -Aggravating/alleviating factors: -Prior cardiac history: -Prior ECG:  -Prior workup: -Prior treatment: -Alcohol: -Tobacco: -Comorbidities:  -Exercise level: -Cardiac ROS: no shortness of breath, no PND, no orthopnea, no LE edema, no syncope -Family history:     She denies any palpitations, chest pain, or shortness of breath. No lightheadedness, headaches, syncope, orthopnea, PND, lower extremity edema or exertional symptoms.  Past Medical History:  Diagnosis Date   Complication of anesthesia    Diabetes mellitus without complication (HCC)    DJD (degenerative joint disease)    Hypertension    PONV (postoperative nausea and vomiting)    PVD (peripheral vascular disease) ([place])     Past Surgical History:  Procedure Laterality Date    ABDOMINAL HYSTERECTOMY     BREAST EXCISIONAL BIOPSY Right    negative   CATARACT EXTRACTION, BILATERAL     EYE SURGERY     GASTRIC BYPASS  2001   TOTAL KNEE ARTHROPLASTY Right 07/13/2018   TOTAL KNEE ARTHROPLASTY Right 07/13/2018   Procedure: RIGHT TOTAL KNEE ARTHROPLASTY;  Surgeon: Jessy Oto, MD;  Location: [place];  Service: Orthopedics;  Laterality: Right;  Needs RNFA    Current Medications: Current Outpatient Medications on File Prior to Visit  Medication Sig   albuterol (PROVENTIL HFA;VENTOLIN HFA) 108 (90 Base) MCG/ACT inhaler Inhale 1-2 puffs into the lungs every 4 (four) hours as needed for wheezing or shortness of breath.    amoxicillin-clavulanate (AUGMENTIN) 875-125 MG tablet Take 1 tablet 1 hour before dental procedure and then another tablet 8 hours after procedure   aspirin 81 MG EC tablet Take 1 tablet (81 mg total) by mouth 2 (two) times daily after a meal.   baclofen (LIORESAL) 10 MG tablet Take 1 tablet (10 mg total) by mouth 3 (three) times daily.   cilostazol (PLETAL) 100 MG tablet Take 100 mg by mouth 2 (two) times daily.    diclofenac Sodium (VOLTAREN) 1 % GEL Apply 2 g topically 4 (four) times daily.   dorzolamide-timolol (COSOPT) 22.3-6.8 MG/ML ophthalmic solution Place 1 drop into both eyes 2 (two) times daily.   furosemide (LASIX) 80 MG tablet Take 40 mg by mouth daily as needed for fluid.    gabapentin (NEURONTIN) 100 MG capsule Take 100 mg by mouth at bedtime.    hydrochlorothiazide (HYDRODIURIL) 12.5 MG tablet Take 1 tablet by mouth daily.   latanoprost (XALATAN) 0.005 % ophthalmic solution Place 1 drop into both eyes every morning.   linaclotide (  LINZESS) 290 MCG CAPS capsule Take 290 mcg by mouth daily before breakfast.   losartan (COZAAR) 100 MG tablet Take 100 mg by mouth daily.   metformin (FORTAMET) 1000 MG (OSM) 24 hr tablet Take 1,000 mg by mouth every morning.   methocarbamol (ROBAXIN) 500 MG tablet Take 1 tablet (500 mg total) by mouth every 6  (six) hours as needed.   metoprolol succinate (TOPROL-XL) 50 MG 24 hr tablet Take 1 tablet (50 mg total) by mouth daily.   montelukast (SINGULAIR) 10 MG tablet Take 10 mg by mouth every morning.   Multiple Vitamins-Minerals (MULTIVITAMIN WITH MINERALS) tablet Take 1 tablet by mouth daily.   ondansetron (ZOFRAN) 4 MG tablet TAKE 1 TABLET BY MOUTH EVERY 8 HOURS AS NEEDED FOR NAUSEA FOR VOMITING   ondansetron (ZOFRAN-ODT) 4 MG disintegrating tablet TAKE 1 TABLET BY MOUTH EVERY 8 HOURS AS NEEDED FOR NAUSEA AND VOMITING. INS MAX 18/21 DAYS   oxybutynin (DITROPAN) 5 MG tablet Take 5 mg by mouth daily.   OZEMPIC, 0.25 OR 0.5 MG/DOSE, 2 MG/1.5ML SOPN Inject 50 Units into the muscle once a week. Wednesday   pantoprazole (PROTONIX) 40 MG tablet Take 40 mg by mouth daily.   phentermine (ADIPEX-P) 37.5 MG tablet Take 1 tablet by mouth daily before breakfast.    potassium chloride SA (K-DUR,KLOR-CON) 20 MEQ tablet Take 20 mEq by mouth 2 (two) times daily. *DISSOLVABLE   senna-docusate (SENOKOT-S) 8.6-50 MG tablet Take 1 tablet by mouth 2 (two) times daily.   traMADol (ULTRAM) 50 MG tablet Take 2 tablets (100 mg total) by mouth every 6 (six) hours as needed.   TRESIBA FLEXTOUCH 200 UNIT/ML SOPN Inject 25 Units into the muscle at bedtime.    Vitamin D, Ergocalciferol, (DRISDOL) 50000 units CAPS capsule Take 50,000 Units by mouth every 7 (seven) days. Tuesday   No current facility-administered medications on file prior to visit.     Allergies:   Hydrocodone-acetaminophen and Nsaids   Social History   Tobacco Use   Smoking status: Never   Smokeless tobacco: Never  Vaping Use   Vaping Use: Never used  Substance Use Topics   Alcohol use: No   Drug use: No    Comment: uses CBD oil on her knee    Family History: family history includes Breast cancer in her daughter; Diabetes in her mother; Heart disease in her brother; Prostate cancer in her father.  ROS:   Please see the history of present illness.   Additional pertinent ROS: Constitutional: Negative for chills, fever, night sweats, unintentional weight loss  HENT: Negative for ear pain and hearing loss.   Eyes: Negative for loss of vision and eye pain.  Respiratory: Negative for cough, sputum, wheezing.   Cardiovascular: See HPI. Gastrointestinal: Negative for abdominal pain, melena, and hematochezia.  Genitourinary: Negative for dysuria and hematuria.  Musculoskeletal: Negative for falls and myalgias.  Skin: Negative for itching and rash.  Neurological: Negative for focal weakness, focal sensory changes and loss of consciousness.  Endo/Heme/Allergies: Does not bruise/bleed easily.     EKGs/Labs/Other Studies Reviewed:    The following studies were reviewed today:  Right LE Venous Reflux 08/10/2021: Summary:  Right:  - No evidence of deep vein thrombosis seen in the right lower extremity,  from the common femoral through the popliteal veins.  - No evidence of superficial venous thrombosis in the right lower  extremity.     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  - No evidence of superficial vein  reflux.   Bilateral LE Venous Reflux 12/21/2019: Summary:  Bilateral:  - No evidence of deep vein thrombosis seen in the lower extremities,  bilaterally, from the common femoral through the popliteal veins.  - No evidence of superficial venous thrombosis in the lower extremities,  bilaterally.     Right:  - Venous reflux is noted in the right common femoral vein.  - Venous reflux is noted in the right sapheno-femoral junction.  - Venous reflux is noted in the right greater saphenous vein in the thigh.     Left:  - Venous reflux is noted in the left common femoral vein.   Echo 07/15/2018: Study Conclusions  - Left ventricle: The cavity size was normal. Systolic function was    normal. The estimated ejection fraction was in the range of 55%    to 60%. There was dynamic obstruction at rest, with a peak    velocity  of 453 cm/sec and a peak gradient of 82 mm Hg. Wall    motion was normal; there were no regional wall motion    abnormalities. Doppler parameters are consistent with abnormal    left ventricular relaxation (grade 1 diastolic dysfunction).    Doppler parameters are consistent with high ventricular filling    pressure.  - Aortic valve: Transvalvular velocity was within the normal range.    There was no stenosis. There was moderate regurgitation. Valve    area (VTI): 2.27 cm^2. Valve area (Vmax): 1.99 cm^2. Valve area    (Vmean): 2.28 cm^2. Regurgitation pressure half-time: 321 ms.  - Mitral valve: Moderately calcified annulus. Transvalvular    velocity was within the normal range. There was no evidence for    stenosis. There was trivial regurgitation.  - Left atrium: The atrium was moderately dilated.  - Right ventricle: The cavity size was normal. Wall thickness was    normal. Systolic function was normal.  - Tricuspid valve: There was mild regurgitation.  - Pulmonary arteries: Systolic pressure was within the normal    range. PA peak pressure: 24 mm Hg (S).  - Pericardium, extracardiac: A trivial pericardial effusion was    identified.   EKG:  EKG is personally reviewed.   10/10/2021: ***  Recent Labs: No results found for requested labs within last 8760 hours.   Recent Lipid Panel No results found for: CHOL, TRIG, HDL, CHOLHDL, VLDL, LDLCALC, LDLDIRECT  Physical Exam:    VS:  There were no vitals taken for this visit.    Wt Readings from Last 3 Encounters:  08/10/21 192 lb (87.1 kg)  05/08/21 192 lb (87.1 kg)  12/21/19 200 lb (90.7 kg)    GEN: Well nourished, well developed in no acute distress HEENT: Normal, moist mucous membranes NECK: No JVD CARDIAC: regular rhythm, normal S1 and S2, no rubs or gallops. No murmur. VASCULAR: Radial and DP pulses 2+ bilaterally. No carotid bruits RESPIRATORY:  Clear to auscultation without rales, wheezing or rhonchi  ABDOMEN: Soft,  non-tender, non-distended MUSCULOSKELETAL:  Ambulates independently SKIN: Warm and dry, no edema NEUROLOGIC:  Alert and oriented x 3. No focal neuro deficits noted. PSYCHIATRIC:  Normal affect    ASSESSMENT:    No diagnosis found. PLAN:     Cardiac risk counseling and prevention recommendations: -recommend heart healthy/Mediterranean diet, with whole grains, fruits, vegetable, fish, lean meats, nuts, and olive oil. Limit salt. -recommend moderate walking, 3-5 times/week for 30-50 minutes each session. Aim for at least 150 minutes.week. Goal should be pace of 3 miles/hours, or walking  1.5 miles in 30 minutes -recommend avoidance of tobacco products. Avoid excess alcohol. -ASCVD risk score: The ASCVD Risk score (Arnett DK, et al., 2019) failed to calculate for the following reasons:   Cannot find a previous HDL lab   Cannot find a previous total cholesterol lab    Plan for follow up: *** or sooner as needed.  Buford Dresser, MD, PhD, [facility]    Medication Adjustments/Labs and Tests Ordered: Current medicines are reviewed at length with the patient today.  Concerns regarding medicines are outlined above.   No orders of the defined types were placed in this encounter.  No orders of the defined types were placed in this encounter.  There are no Patient Instructions on file for this visit.   I,Mathew Stumpf,acting as a Education administrator for [employer], MD.,have documented all relevant documentation on the behalf of Buford Dresser, MD,as directed by  Buford Dresser, MD while in the presence of Buford Dresser, MD.  ***  Signed, Buford Dresser, MD PhD 10/10/2021 7:30 AM    [place]

## 2021-10-12 NOTE — Telephone Encounter (Signed)
Pt does not meet criteria for [facility], needs gen cards, ref has been sent there

## 2022-06-18 ENCOUNTER — Ambulatory Visit: Payer: Medicare PPO | Admitting: Physician Assistant

## 2023-01-09 ENCOUNTER — Encounter: Payer: Self-pay | Admitting: Oncology

## 2023-01-13 ENCOUNTER — Telehealth (HOSPITAL_COMMUNITY): Payer: Self-pay

## 2023-01-13 NOTE — Telephone Encounter (Signed)
Pt insurance is active and benefits verified through Medicare A/B. Co-pay $0.00, DED $240.00/$240.00 met, out of pocket $0.00/$0.00 met, co-insurance 20%. No pre-authorization required. Passport, 01/13/23 @ 4:01PM, REF#[id number]   How many CR sessions are covered? (36 sessions for TCR, 72 sessions for ICR)72 Is this a lifetime maximum or an annual maximum? Annual Has the member used any of these services to date? No Is there a time limit (weeks/months) on start of program and/or program completion? No     Will contact patient to see if she is interested in the Cardiac Rehab Program. If interested, patient will need to complete follow up appt. Once completed, patient will be contacted for scheduling upon review by the RN Navigator.

## 2023-01-23 ENCOUNTER — Telehealth (HOSPITAL_COMMUNITY): Payer: Self-pay

## 2023-01-23 NOTE — Telephone Encounter (Signed)
Still holding pending fax from Dr. Jarold Motto office, will refax to [phone].

## 2023-02-04 ENCOUNTER — Encounter (HOSPITAL_COMMUNITY): Payer: Self-pay

## 2023-02-04 ENCOUNTER — Telehealth (HOSPITAL_COMMUNITY): Payer: Self-pay

## 2023-02-04 NOTE — Telephone Encounter (Signed)
Called and spoke with pt in regards to CR, pt stated she was getting PT at the time and would give [place] a call back.

## 2023-02-04 NOTE — Telephone Encounter (Signed)
Attempted to call patient daughter Tobi Bastos in regards to Cardiac Rehab - LM on VM    Mailed letter

## 2023-02-20 ENCOUNTER — Telehealth (HOSPITAL_COMMUNITY): Payer: Self-pay

## 2023-02-20 NOTE — Telephone Encounter (Signed)
No response from pt.  Closed referral  

## 2024-07-30 ENCOUNTER — Ambulatory Visit
Admission: EM | Admit: 2024-07-30 | Discharge: 2024-07-30 | Disposition: A | Discharge status: Home / Self Care | Attending: Emergency Medicine | Admitting: Emergency Medicine

## 2024-07-30 ENCOUNTER — Encounter: Payer: Self-pay | Admitting: Emergency Medicine

## 2024-07-30 DIAGNOSIS — K529 Noninfective gastroenteritis and colitis, unspecified: Secondary | ICD-10-CM | POA: Diagnosis not present

## 2024-07-30 DIAGNOSIS — R112 Nausea with vomiting, unspecified: Secondary | ICD-10-CM

## 2024-07-30 LAB — COMPREHENSIVE METABOLIC PANEL WITH GFR
ALT: 37 U/L (ref 0–44)
AST: 36 U/L (ref 15–41)
Albumin: 3.9 g/dL (ref 3.5–5.0)
Alkaline Phosphatase: 56 U/L (ref 38–126)
Anion gap: 14 (ref 5–15)
BUN: 18 mg/dL (ref 8–23)
CO2: 23 mmol/L (ref 22–32)
Calcium: 8.5 mg/dL — ABNORMAL LOW (ref 8.9–10.3)
Chloride: 98 mmol/L (ref 98–111)
Creatinine, Ser: 0.81 mg/dL (ref 0.44–1.00)
GFR, Estimated: >60 mL/min (ref >60)
Glucose, Bld: 262 mg/dL — ABNORMAL HIGH (ref 70–99)
Potassium: 3.9 mmol/L (ref 3.5–5.1)
Sodium: 135 mmol/L (ref 135–145)
Total Bilirubin: 0.8 mg/dL (ref 0.0–1.2)
Total Protein: 7 g/dL (ref 6.5–8.1)

## 2024-07-30 LAB — CBC WITH DIFFERENTIAL/PLATELET
Abs Immature Granulocytes: 0.02 K/uL (ref 0.00–0.07)
Basophils Absolute: 0 K/uL (ref 0.0–0.1)
Basophils Relative: 1 %
Eosinophils Absolute: 0.1 K/uL (ref 0.0–0.5)
Eosinophils Relative: 1 %
HCT: 34.5 % — ABNORMAL LOW (ref 36.0–46.0)
Hemoglobin: 11.7 g/dL — ABNORMAL LOW (ref 12.0–15.0)
Immature Granulocytes: 0 %
Lymphocytes Relative: 20 %
Lymphs Abs: 0.9 K/uL (ref 0.7–4.0)
MCH: 30.9 pg (ref 26.0–34.0)
MCHC: 33.9 g/dL (ref 30.0–36.0)
MCV: 91 fL (ref 80.0–100.0)
Monocytes Absolute: 0.2 K/uL (ref 0.1–1.0)
Monocytes Relative: 5 %
Neutro Abs: 3.5 K/uL (ref 1.7–7.7)
Neutrophils Relative %: 73 %
Platelets: 185 K/uL (ref 150–400)
RBC: 3.79 MIL/uL — ABNORMAL LOW (ref 3.87–5.11)
RDW: 13.4 % (ref 11.5–15.5)
WBC: 4.7 K/uL (ref 4.0–10.5)
nRBC: 0 % (ref 0.0–0.2)

## 2024-07-30 LAB — GLUCOSE, CAPILLARY: Glucose-Capillary: 253 mg/dL — ABNORMAL HIGH (ref 70–99)

## 2024-07-30 LAB — RESP PANEL BY RT-PCR (FLU A&B, COVID) ARPGX2
Influenza A by PCR: NEGATIVE
Influenza B by PCR: NEGATIVE
SARS Coronavirus 2 by RT PCR: NEGATIVE

## 2024-07-30 MED ORDER — ONDANSETRON 8 MG PO TBDP: 8.0000 mg | ORAL_TABLET | Freq: Three times a day (TID) | ORAL | 0 refills | Status: AC | PRN

## 2024-07-30 MED ORDER — DICYCLOMINE HCL 20 MG PO TABS
20.0000 mg | ORAL_TABLET | Freq: Two times a day (BID) | ORAL | 0 refills | Status: AC
Start: 2024-07-30 — End: ?

## 2024-07-30 NOTE — ED Provider Notes (Signed)
 [facility]    CSN: [id number] Arrival date & time: 07/30/24  1627      History   Chief Complaint Chief Complaint  Patient presents with   Nausea   Emesis    HPI Alexandra Tran is a 78 y.o. female.   HPI  78 year old female with past medical history significant for diabetes, degenerative joint disease, hypertension, and peripheral vascular disease presents for evaluation of nausea, vomiting, and diarrhea.  She reports that the diarrhea has been ongoing for approximately the last month.  Her family member, who brought her, reports that at about 2:00 this afternoon she received a message that the patient was complaining of nausea and had been experiencing some vomiting.  She does have a history of GERD.  She is endorsing lower abdominal pain and reports that that is also been an ongoing issue.  No fever or respiratory symptoms and no known sick contacts.  She does report that she had similar symptoms the last time she had a heart attack and did not experience any chest pain or shortness of breath, which she also does not have now.  Past Medical History:  Diagnosis Date   Complication of anesthesia    Diabetes mellitus without complication (HCC)    DJD (degenerative joint disease)    Hypertension    PONV (postoperative nausea and vomiting)    PVD (peripheral vascular disease)     Patient Active Problem List   Diagnosis Date Noted   Allergic rhinitis 08/09/2021   Headache disorder 06/19/2021   Mood disorder 06/19/2021   Numbness and tingling 06/19/2021   Osteopenia of multiple sites 10/24/2020   Malignant neoplasm of upper-outer quadrant of left breast in female, estrogen receptor positive (HCC) 06/08/2020   Anemia 08/05/2018   Acute blood loss anemia 07/14/2018    Class: Acute   Tachycardia 07/14/2018   Diabetes mellitus with hyperglycemia (HCC) 07/14/2018   Essential hypertension 07/14/2018   Unilateral primary osteoarthritis, right knee 07/13/2018     Class: Chronic   Peripheral vascular disease of extremity 07/13/2018    Class: Chronic   S/P TKR (total knee replacement) using cement, right 07/13/2018   Primary open angle glaucoma of left eye, indeterminate stage 01/05/2018   Primary open angle glaucoma of right eye, indeterminate stage 01/05/2018   Pseudophakia of both eyes 04/17/2017   Stable proliferative diabetic retinopathy of both eyes associated with type 2 diabetes mellitus (HCC) 04/17/2017   Chronic constipation 02/17/2017   Fibrocystic breast 02/17/2017   GERD (gastroesophageal reflux disease) 02/17/2017   HLD (hyperlipidemia) 02/17/2017   Ocular hypertension 02/17/2017   Recurrent sinusitis 02/17/2017   Venous stasis dermatitis of both lower extremities 02/17/2017   Vitamin D  deficiency disease 02/17/2017   Venous stasis of both lower extremities 02/17/2017   S/P lumbar fusion 10/09/2015   Reactive airway disease 09/06/2015   Sleep concern 09/06/2015   DDD (degenerative disc disease), lumbosacral 07/21/2015   Spondylolisthesis of lumbar region 07/21/2015   Arthritis 11/09/2014   Cervical herniated disc 11/09/2014   IBS (irritable bowel syndrome) 11/09/2014   Chronic pain 11/09/2014   Fibromyalgia 11/09/2014   Leg edema 11/09/2014   OAB (overactive bladder) 11/09/2014   Seasonal allergies 11/09/2014   PAD (peripheral artery disease) 11/09/2014   Carpal tunnel syndrome of right wrist 11/02/2014   Diabetes mellitus with ophthalmic manifestation (HCC) 08/09/2014   Edema 12/08/2013   History of gastric bypass 03/03/2012   Weakness generalized 03/03/2012    Past Surgical History:  Procedure Laterality Date  ABDOMINAL HYSTERECTOMY     BREAST EXCISIONAL BIOPSY Right    negative   CATARACT EXTRACTION, BILATERAL     EYE SURGERY     GASTRIC BYPASS  2001   TOTAL KNEE ARTHROPLASTY Right 07/13/2018   TOTAL KNEE ARTHROPLASTY Right 07/13/2018   Procedure: RIGHT TOTAL KNEE ARTHROPLASTY;  Surgeon: Lucilla Lynwood BRAVO, MD;   Location: [facility] OR;  Service: Orthopedics;  Laterality: Right;  Needs RNFA    OB History   No obstetric history on file.      Home Medications    Prior to Admission medications   Medication Sig Start Date End Date Taking? Authorizing Provider  dicyclomine (BENTYL) 20 MG tablet Take 1 tablet (20 mg total) by mouth 2 (two) times daily. 07/30/24  Yes Bernardino Ditch, NP  ondansetron  (ZOFRAN -ODT) 8 MG disintegrating tablet Take 1 tablet (8 mg total) by mouth every 8 (eight) hours as needed for nausea or vomiting. 07/30/24  Yes Bernardino Ditch, NP  albuterol  (PROVENTIL  HFA;VENTOLIN  HFA) 108 (90 Base) MCG/ACT inhaler Inhale 1-2 puffs into the lungs every 4 (four) hours as needed for wheezing or shortness of breath.     [provider]  amoxicillin -clavulanate (AUGMENTIN ) 875-125 MG tablet Take 1 tablet 1 hour before dental procedure and then another tablet 8 hours after procedure 01/18/19   Quin Lynwood HERO, PA-C  aspirin  81 MG EC tablet Take 1 tablet (81 mg total) by mouth 2 (two) times daily after a meal. 07/17/18   Donnamarie Lebron PARAS, MD  baclofen  (LIORESAL ) 10 MG tablet Take 1 tablet (10 mg total) by mouth 3 (three) times daily. 09/07/18   Nitka, James E, MD  cilostazol  (PLETAL ) 100 MG tablet Take 100 mg by mouth 2 (two) times daily.  08/19/16   [provider]  diclofenac  Sodium (VOLTAREN ) 1 % GEL Apply 2 g topically 4 (four) times daily. 09/02/19   Nitka, James E, MD  dorzolamide -timolol  (COSOPT ) 22.3-6.8 MG/ML ophthalmic solution Place 1 drop into both eyes 2 (two) times daily. 05/26/18   [provider]  furosemide  (LASIX ) 80 MG tablet Take 40 mg by mouth daily as needed for fluid.     [provider]  gabapentin  (NEURONTIN ) 100 MG capsule Take 100 mg by mouth at bedtime.     [provider]  hydrochlorothiazide  (HYDRODIURIL ) 12.5 MG tablet Take 1 tablet by mouth daily. 05/13/18   [provider]  latanoprost  (XALATAN ) 0.005 % ophthalmic solution  Place 1 drop into both eyes every morning.    [provider]  linaclotide  (LINZESS ) 290 MCG CAPS capsule Take 290 mcg by mouth daily before breakfast.    [provider]  losartan  (COZAAR ) 100 MG tablet Take 100 mg by mouth daily. 05/05/18   [provider]  metformin (FORTAMET) 1000 MG (OSM) 24 hr tablet Take 1,000 mg by mouth every morning. 05/21/18   [provider]  methocarbamol  (ROBAXIN ) 500 MG tablet Take 1 tablet (500 mg total) by mouth every 6 (six) hours as needed. 12/22/19   Nitka, James E, MD  metoprolol  succinate (TOPROL -XL) 50 MG 24 hr tablet Take 1 tablet (50 mg total) by mouth daily. 07/17/18   Ezenduka, Nkeiruka J, MD  montelukast  (SINGULAIR ) 10 MG tablet Take 10 mg by mouth every morning.    [provider]  Multiple Vitamins-Minerals (MULTIVITAMIN WITH MINERALS) tablet Take 1 tablet by mouth daily.    [provider]  ondansetron  (ZOFRAN ) 4 MG tablet TAKE 1 TABLET BY MOUTH EVERY 8 HOURS AS NEEDED  FOR NAUSEA FOR VOMITING 12/20/19   Lucilla Lynwood BRAVO, MD  oxybutynin  (DITROPAN ) 5 MG tablet Take 5 mg by mouth daily. 03/28/18   [provider]  OZEMPIC, 0.25 OR 0.5 MG/DOSE, 2 MG/1.5ML SOPN Inject 50 Units into the muscle once a week. Wednesday 04/30/18   [provider]  pantoprazole  (PROTONIX ) 40 MG tablet Take 40 mg by mouth daily.    [provider]  phentermine  (ADIPEX-P ) 37.5 MG tablet Take 1 tablet by mouth daily before breakfast.  04/19/18   [provider]  potassium chloride  SA (K-DUR,KLOR-CON ) 20 MEQ tablet Take 20 mEq by mouth 2 (two) times daily. *DISSOLVABLE    [provider]  senna-docusate (SENOKOT-S) 8.6-50 MG tablet Take 1 tablet by mouth 2 (two) times daily. 07/17/18   Ezenduka, Nkeiruka J, MD  traMADol  (ULTRAM ) 50 MG tablet Take 2 tablets (100 mg total) by mouth every 6 (six) hours as needed. 09/30/19   Nitka, James E, MD  TRESIBA  FLEXTOUCH 200 UNIT/ML SOPN Inject 25 Units into  the muscle at bedtime.  04/06/18   [provider]  Vitamin D , Ergocalciferol , (DRISDOL ) 50000 units CAPS capsule Take 50,000 Units by mouth every 7 (seven) days. Tuesday    [provider]    Family History Family History  Problem Relation Age of Onset   Diabetes Mother    Prostate cancer Father    Heart disease Brother    Breast cancer Daughter     Social History Social History   Tobacco Use   Smoking status: Never   Smokeless tobacco: Never  Vaping Use   Vaping status: Never Used  Substance Use Topics   Alcohol use: No   Drug use: No    Comment: uses CBD oil on her knee     Allergies   Hydrocodone -acetaminophen  and Nsaids   Review of Systems Review of Systems  Constitutional:  Negative for fever.  HENT:  Negative for congestion, ear pain and rhinorrhea.   Respiratory:  Negative for cough and shortness of breath.   Cardiovascular:  Negative for chest pain.  Gastrointestinal:  Positive for abdominal pain, diarrhea, nausea and vomiting. Negative for blood in stool.  Neurological:  Positive for light-headedness.     Physical Exam Triage Vital Signs ED Triage Vitals  Encounter Vitals Group     BP      Girls Systolic BP Percentile      Girls Diastolic BP Percentile      Boys Systolic BP Percentile      Boys Diastolic BP Percentile      Pulse      Resp      Temp      Temp src      SpO2      Weight      Height      Head Circumference      Peak Flow      Pain Score      Pain Loc      Pain Education      Exclude from Growth Chart    No data found.  Updated Vital Signs BP (!) 157/85 (BP Location: Left Arm)   Pulse 89   Temp 98.7 F (37.1 C) (Oral)   Resp 16   SpO2 97%   Visual Acuity Right Eye Distance:   Left Eye Distance:   Bilateral Distance:    Right Eye Near:   Left Eye Near:    Bilateral Near:     Physical Exam Vitals and nursing  note reviewed.  Constitutional:      Appearance: Normal appearance. She is not  ill-appearing.  HENT:     Head: Normocephalic and atraumatic.  Cardiovascular:     Rate and Rhythm: Normal rate and regular rhythm.     Pulses: Normal pulses.     Heart sounds: Normal heart sounds. No murmur heard.    No friction rub. No gallop.  Pulmonary:     Effort: Pulmonary effort is normal.     Breath sounds: Normal breath sounds. No wheezing, rhonchi or rales.  Abdominal:     Palpations: Abdomen is soft.     Tenderness: There is abdominal tenderness. There is no guarding or rebound.     Comments: Generalized abdominal tenderness.  No focal findings.  No guarding or rebound.  Skin:    General: Skin is warm and dry.     Capillary Refill: Capillary refill takes less than 2 seconds.     Findings: No rash.  Neurological:     General: No focal deficit present.     Mental Status: She is alert and oriented to person, place, and time.      UC Treatments / Results  Labs (all labs ordered are listed, but only abnormal results are displayed) Labs Reviewed  COMPREHENSIVE METABOLIC PANEL WITH GFR - Abnormal; Notable for the following components:      Result Value   Glucose, Bld 262 (*)    Calcium 8.5 (*)    All other components within normal limits  GLUCOSE, CAPILLARY - Abnormal; Notable for the following components:   Glucose-Capillary 253 (*)    All other components within normal limits  CBC WITH DIFFERENTIAL/PLATELET - Abnormal; Notable for the following components:   RBC 3.79 (*)    Hemoglobin 11.7 (*)    HCT 34.5 (*)    All other components within normal limits  RESP PANEL BY RT-PCR (FLU A&B, COVID) ARPGX2  CBG MONITORING, ED    EKG Normal sinus rhythm with sinus arrhythmia Ventricular rate 91 bpm PR DeVeau 140 ms QRS duration 92 ms QT/QTc 378/464 ms No ST or T wave abnormalities noted.  Radiology No results found.  Procedures Procedures (including critical care time)  Medications Ordered in UC Medications - No data to display  Initial Impression /  Assessment and Plan / UC Course  I have reviewed the triage vital signs and the nursing notes.  Pertinent labs & imaging results that were available during my care of the patient were reviewed by me and considered in my medical decision making (see chart for details).   Patient is a nontoxic-appearing 78 year old female presenting for evaluation of GI symptoms as outlined in HPI above.  As mentioned above, patient has had ongoing diarrhea for approximately a month.  She reports that mostly happens at night.  Today she developed with nausea and vomiting.  She is also reporting ongoing lower abdominal pain.  She does report that last time she had a heart attack it presented with nausea and vomiting and none of the associated typical signs of chest pain, shortness of breath, sweating, pain to left shoulder or jaw, or pain to the back.  Patient's vital signs are very reassuring.  Her cardiopulmonary exam is benign.  Her abdomen is soft, mildly protuberant, with generalized abdominal tenderness but no focal finding.  Given that patient reports symptoms were similar to her previous MI I will obtain an EKG to evaluate for any acute cardiac component to her symptoms.  I will also order  a COVID and flu PCR given that COVID and flu can both present with GI symptoms only.  Additionally, I will order CBC to evaluate for any potential systemic infection and a CMP to evaluate for any electrolyte abnormalities or disturbance of transaminases.  Fingerstick glucose 253.  EKG shows normal sinus rhythm with sinus arrhythmia.  There is no evidence of ST or T wave abnormalities noted.  No significant changes when compared to EKG dated 07/16/2018.  CBC shows a normal white count of 4.7.  Patient has a mildly low red count of 3.79 and mildly low H&H of 11.7 and 34.5 respectively.  Platelets are normal.  No abnormalities the differential.  CMP shows normal electrolytes, mildly elevated glucose of 262, mildly decreased calcium  of 8.5, renal function transaminases are unremarkable.  Respiratory panel is negative for COVID or influenza.  I will discharge patient with a diagnosis of gastroenteritis with prescription for Zofran  for the nausea and Bentyl she can use for abdominal cramping.  Given that she has been experiencing diarrhea for a month we will also make a referral to gastroenterology for evaluation.   Final Clinical Impressions(s) / UC Diagnoses   Final diagnoses:  Nausea and vomiting, unspecified vomiting type  Gastroenteritis     Discharge Instructions      Take the Zofran  every 8 hours as needed for nausea and vomiting.  They are an oral disintegrating tablet and you can place them on her under your tongue and then will be absorbed.  Use the Bentyl (dicyclomine) every 6 hours as needed for abdominal cramping.  Follow a clear liquid diet for the next 6 to 12 hours.  Clear liquids consist of broth, ginger ale, water, Pedialyte, and Jell-O.  After 6 to 12 hours, if you are tolerating clear liquids, you can advance to bland foods such as bananas, rice, applesauce, and toast.  If you tolerate bland foods you can continue to advance your diet as you see fit.  If you develop a fever over 100.5, increased abdominal pain, bloody vomit, or bloody stool return for reevaluation or go to the ER.   I have referred you to gastroenterology as well to be evaluated for your ongoing diarrhea.     ED Prescriptions     Medication Sig Dispense Auth. Provider   ondansetron  (ZOFRAN -ODT) 8 MG disintegrating tablet Take 1 tablet (8 mg total) by mouth every 8 (eight) hours as needed for nausea or vomiting. 20 tablet Bernardino Ditch, NP   dicyclomine (BENTYL) 20 MG tablet Take 1 tablet (20 mg total) by mouth 2 (two) times daily. 20 tablet Bernardino Ditch, NP      PDMP not reviewed this encounter.   Bernardino Ditch, NP 07/30/24 [phone]

## 2024-07-30 NOTE — ED Triage Notes (Signed)
 Pt present nausea, vomiting and diarrhea. Symptoms started today.

## 2024-07-30 NOTE — Discharge Instructions (Addendum)
 Take the Zofran  every 8 hours as needed for nausea and vomiting.  They are an oral disintegrating tablet and you can place them on her under your tongue and then will be absorbed.  Use the Bentyl (dicyclomine) every 6 hours as needed for abdominal cramping.  Follow a clear liquid diet for the next 6 to 12 hours.  Clear liquids consist of broth, ginger ale, water, Pedialyte, and Jell-O.  After 6 to 12 hours, if you are tolerating clear liquids, you can advance to bland foods such as bananas, rice, applesauce, and toast.  If you tolerate bland foods you can continue to advance your diet as you see fit.  If you develop a fever over 100.5, increased abdominal pain, bloody vomit, or bloody stool return for reevaluation or go to the ER.   I have referred you to gastroenterology as well to be evaluated for your ongoing diarrhea.

## 2024-08-10 ENCOUNTER — Encounter: Payer: Self-pay | Admitting: Oncology
